# Patient Record
Sex: Female | Born: 1960
Health system: Southern US, Community
[De-identification: ages and names within clinical notes are randomized; demographics above are authoritative.]

## PROBLEM LIST (undated history)

## (undated) DIAGNOSIS — F32A Depression, unspecified: Secondary | ICD-10-CM

## (undated) DIAGNOSIS — E039 Hypothyroidism, unspecified: Secondary | ICD-10-CM

## (undated) DIAGNOSIS — G709 Myoneural disorder, unspecified: Secondary | ICD-10-CM

## (undated) DIAGNOSIS — F419 Anxiety disorder, unspecified: Secondary | ICD-10-CM

## (undated) DIAGNOSIS — F329 Major depressive disorder, single episode, unspecified: Secondary | ICD-10-CM

## (undated) DIAGNOSIS — C801 Malignant (primary) neoplasm, unspecified: Secondary | ICD-10-CM

## (undated) HISTORY — PX: DIAGNOSTIC LAPAROSCOPY: SUR761

## (undated) HISTORY — PX: ABDOMINAL HYSTERECTOMY: SHX81

## (undated) HISTORY — PX: CHOLECYSTECTOMY: SHX55

## (undated) HISTORY — PX: GANGLION CYST EXCISION: SHX1691

---

## 2000-06-17 ENCOUNTER — Ambulatory Visit (HOSPITAL_COMMUNITY): Admission: RE | Admit: 2000-06-17 | Discharge: 2000-06-17 | Payer: Self-pay | Admitting: *Deleted

## 2000-06-17 ENCOUNTER — Encounter (INDEPENDENT_AMBULATORY_CARE_PROVIDER_SITE_OTHER): Payer: Self-pay | Admitting: *Deleted

## 2000-12-24 HISTORY — PX: NECK SURGERY: SHX720

## 2000-12-24 HISTORY — PX: CERVICAL SPINE SURGERY: SHX589

## 2002-05-29 ENCOUNTER — Other Ambulatory Visit: Admission: RE | Admit: 2002-05-29 | Discharge: 2002-05-29 | Payer: Self-pay | Admitting: Obstetrics and Gynecology

## 2002-06-05 ENCOUNTER — Encounter: Admission: RE | Admit: 2002-06-05 | Discharge: 2002-06-05 | Payer: Self-pay | Admitting: Obstetrics and Gynecology

## 2002-06-05 ENCOUNTER — Encounter: Payer: Self-pay | Admitting: Obstetrics and Gynecology

## 2002-06-11 ENCOUNTER — Encounter: Admission: RE | Admit: 2002-06-11 | Discharge: 2002-06-11 | Payer: Self-pay | Admitting: Obstetrics and Gynecology

## 2002-06-11 ENCOUNTER — Encounter: Payer: Self-pay | Admitting: Obstetrics and Gynecology

## 2002-06-11 ENCOUNTER — Encounter (INDEPENDENT_AMBULATORY_CARE_PROVIDER_SITE_OTHER): Payer: Self-pay | Admitting: Specialist

## 2002-12-24 HISTORY — PX: NECK SURGERY: SHX720

## 2003-06-22 ENCOUNTER — Other Ambulatory Visit: Admission: RE | Admit: 2003-06-22 | Discharge: 2003-06-22 | Payer: Self-pay | Admitting: Obstetrics and Gynecology

## 2003-08-17 ENCOUNTER — Encounter: Payer: Self-pay | Admitting: Orthopaedic Surgery

## 2003-08-19 ENCOUNTER — Encounter: Payer: Self-pay | Admitting: Orthopaedic Surgery

## 2003-08-19 ENCOUNTER — Ambulatory Visit (HOSPITAL_COMMUNITY): Admission: RE | Admit: 2003-08-19 | Discharge: 2003-08-20 | Payer: Self-pay | Admitting: Orthopaedic Surgery

## 2003-08-20 ENCOUNTER — Encounter: Payer: Self-pay | Admitting: Orthopaedic Surgery

## 2003-10-19 ENCOUNTER — Inpatient Hospital Stay (HOSPITAL_COMMUNITY): Admission: AD | Admit: 2003-10-19 | Discharge: 2003-10-21 | Payer: Self-pay | Admitting: Obstetrics and Gynecology

## 2003-10-19 ENCOUNTER — Encounter (INDEPENDENT_AMBULATORY_CARE_PROVIDER_SITE_OTHER): Payer: Self-pay | Admitting: *Deleted

## 2004-08-23 ENCOUNTER — Other Ambulatory Visit: Admission: RE | Admit: 2004-08-23 | Discharge: 2004-08-23 | Payer: Self-pay | Admitting: Obstetrics and Gynecology

## 2004-09-08 ENCOUNTER — Encounter: Admission: RE | Admit: 2004-09-08 | Discharge: 2004-09-08 | Payer: Self-pay | Admitting: Obstetrics and Gynecology

## 2004-09-15 ENCOUNTER — Encounter (INDEPENDENT_AMBULATORY_CARE_PROVIDER_SITE_OTHER): Payer: Self-pay | Admitting: *Deleted

## 2004-09-15 ENCOUNTER — Encounter: Admission: RE | Admit: 2004-09-15 | Discharge: 2004-09-15 | Payer: Self-pay | Admitting: Obstetrics and Gynecology

## 2004-12-21 ENCOUNTER — Ambulatory Visit (HOSPITAL_COMMUNITY): Admission: RE | Admit: 2004-12-21 | Discharge: 2004-12-22 | Payer: Self-pay | Admitting: Orthopaedic Surgery

## 2005-08-24 ENCOUNTER — Encounter: Admission: RE | Admit: 2005-08-24 | Discharge: 2005-08-24 | Payer: Self-pay | Admitting: Obstetrics and Gynecology

## 2005-09-14 ENCOUNTER — Other Ambulatory Visit: Admission: RE | Admit: 2005-09-14 | Discharge: 2005-09-14 | Payer: Self-pay | Admitting: Obstetrics and Gynecology

## 2006-09-03 ENCOUNTER — Encounter: Admission: RE | Admit: 2006-09-03 | Discharge: 2006-09-03 | Payer: Self-pay | Admitting: Obstetrics and Gynecology

## 2007-09-15 ENCOUNTER — Encounter: Admission: RE | Admit: 2007-09-15 | Discharge: 2007-09-15 | Payer: Self-pay | Admitting: Obstetrics and Gynecology

## 2008-09-20 ENCOUNTER — Encounter: Admission: RE | Admit: 2008-09-20 | Discharge: 2008-09-20 | Payer: Self-pay | Admitting: Obstetrics and Gynecology

## 2009-04-22 ENCOUNTER — Encounter: Admission: RE | Admit: 2009-04-22 | Discharge: 2009-04-22 | Payer: Self-pay | Admitting: Gastroenterology

## 2009-10-17 ENCOUNTER — Encounter: Admission: RE | Admit: 2009-10-17 | Discharge: 2009-10-17 | Payer: Self-pay | Admitting: Obstetrics and Gynecology

## 2011-05-11 NOTE — Op Note (Signed)
NAME:  Candice Ramirez, Candice Ramirez                        ACCOUNT NO.:  [id number]   MEDICAL RECORD NO.:  [id number]                   PATIENT TYPE:  OIB   LOCATION:  NA                                   FACILITY:  [facility]   PHYSICIAN:  Sharolyn Douglas, M.D.                     DATE OF BIRTH:  [birth year]   DATE OF PROCEDURE:  08/19/2003  DATE OF DISCHARGE:                                 OPERATIVE REPORT   DIAGNOSIS:  C5-6 herniated nucleus pulposus with left upper extremity  radiculopathy.   PROCEDURES:  1. Anterior cervical diskectomy, C5-6, with decompression of the spinal cord     and nerve roots bilaterally.  2. Anterior cervical arthrodesis, C5-6, with placement of an 8 mm Synthes     allograft prosthesis spacer packed with NuGraft bone graft substitute.  3. Anterior cervical plating utilizing the Trinica system, C5-6.  4. Neurologic monitoring utilizing somatosensory evoked potentials and motor-     evoked potentials.   SURGEON:  Sharolyn Douglas, M.D.   ASSISTANT:  Verlin Fester, P.A.   ANESTHESIA:  General endotracheal.   COMPLICATIONS:  None.   INDICATIONS:  The patient is a 50 year old female with a four-plus month  history of progressively worsening neck and left upper extremity radicular  pain, numbness, and weakness.  Her plain radiographs show spondylitic  changes with loss of lordosis.  MRI scan shows a large C5-6 disk herniation  lateralizing to the left with compression of the thecal sac.  She failed an  appropriate course of nonoperative management.  She counseled regarding ACDF  at C5-6 with allograft and plate. She elected to proceed understanding the  risks, benefits, and alternatives.   DESCRIPTION OF PROCEDURE:  The patient was properly identified in the  holding area, taken to the operating room.  She underwent general  endotracheal anesthesia without difficulty.  She was given prophylactic IV  antibiotics.  Neurologic monitoring was established in the form of SSEPs and  motor-evoked potentials.  She was carefully positioned on the operating room  table with the neck in slight extension using the Mayfield head rest.  Five  pounds of Holter traction was applied.  Her neck prepped and draped in the  usual sterile fashion.  A 3 cm incision made at the level of the cricoid  cartilage in a natural skin crease on the left side of the neck.  Dissection  carried down sharply through the platysma.  The interval between the SCM and  strap muscles medially was developed down the prevertebral space.  The first  disk space identified was C5-6, x-ray taken to confirm this location.  Longus colli muscle elevated out over the C5-6 disk space bilaterally.  A  deep Shadow Line retractor placed.  Carotid sheath, esophagus, trachea  identified and protected at all times.  Sharp annulotomy was performed.  Diskectomy carried back to the posterior longitudinal ligament using  Kerrison punches and pituitary rongeurs.  A high-speed bur used to remove  the posterior vertebral margins and uncovertebral joint.  A large disk  fragment was removed from the spinal canal on the left side.  The posterior  longitudinal ligament was taken down and the spinal canal explored.  Foraminotomies were performed bilaterally using 2 mm Kerrison punch.  The  wound was irrigated.  Bleeding was controlled with bipolar electrocautery  and Gelfoam.  An 8 mm Synthes allograft prosthesis spacer was then carefully  tamped into the interspace after being packed with NuGraft bone graft  substitute.  The graft was carefully countersunk 1 mm.  We checked  posteriorly using a blunt probe.  There was room between the graft and the  spinal canal.  A 24 mm Trinica plate was then placed with four 12 mm  variable screws.  We ensured the locking mechanism engaged.  Intraoperative  x-ray showed excellent position of the plate and screws at C5-6.  Deep  Penrose drain was placed.  Platysma muscle closed with 2-0 Vicryl  suture,  subcutaneous layer closed with a 3-0 Vicryl followed by a running 4-0 Vicryl  subcuticular Vicryl suture, Benzoin and Steri-Strips placed, sterile  dressing applied, soft collar placed.  It should be noted that there were no  deleterious changes in the SSEPs or motor-evoked potentials throughout the  procedure.                                               Sharolyn Douglas, M.D.    MC/MEDQ  D:  08/19/2003  T:  08/19/2003  Job:  [id number]

## 2011-05-11 NOTE — Procedures (Signed)
[place]  Patient:    Candice Ramirez, Candice Ramirez                       MRN: [id number] Proc. Date: 06/17/00 Adm. Date:  [id number] Disc. Date: [id number] Attending:  Mingo Amber CC:         Kizzie Furnish, M.D.                           Procedure Report  PROCEDURE:  Video upper endoscopy and video colonoscopy.  INDICATION FOR PROCEDURE:  This is a 50 year old female who has a variety of complaints including chronic diarrhea, intermittent nausea and vomiting, recent Helicobacter pyloric gastritis and history of polyps on anoscopy.  She has also seen blood and mucus in the stool on occasion.  It was my impression that she had irritable bowel syndrome, possibly also related to post cholecystectomy diarrhea.  She was treated with desipramine and Levbid.  She apparently has stopped the desipramine because she thought it was aggravating her migraines.  Her abdominal symptoms are relatively unchanged.  PREPARATIONS:  She is n.p.o. since midnight having taken Phospho-Soda prep and a clear liquid diet.  The mucosa is clean throughout.  PREPROCEDURE SEDATION:  She received 100 mcg of fentanyl and 10 mg of Versed prior to the upper endoscopy.  In addition, her throat was anesthetized with Hurricaine spray and she was on 2 liters of nasal cannula O2.  PROCEDURE IN DETAIL:  The Olympus video upper endoscope was inserted via the mouth and advanced easily through the upper esophageal sphincter.  Intubation was then carried out to the descending duodenum.  On withdrawal the mucosa was carefully evaluated.  Descending duodenum and bulb appeared normal.  It was noted that there was considerable difficulty getting the scope through the pylorus indicating some pylorospasm or possibly a minimal pyloric stricture. There was mild antral gastritis.  A CLOtest was taken.  Retroflex to the GE junction was unremarkable.  Z-line of the esophagus occurred at approximately 38  cm and was somewhat irregular.  The margin was biopsied to rule out Barretts.  At the conclusion of this procedure the patients position was reversed for procedure #2 video colonoscopy.  She required an additional 75 mcg of fentanyl and 7.5 mg of Versed in order to achieve the adequate sedation.  The Olympus video colonoscope was inserted via the rectum and advanced through a tortuous sigmoid to the splenic flexure.  At this point, intubation to the cecum was technically much easier.  Cecal landmarks were identified and photographed.  The ileocecal valve was traversed and the terminal ilium appeared normal.  On withdrawal through the colon the mucosa was carefully evaluated.  The entire right colon was completely normal. Within the rectum were several diminutive polyps that were removed with hot biopsy forceps.  They appeared to be hyperplastic visually.  Retroflexed view of the rectum itself was unremarkable.  The patient tolerated the procedure well.  Multiple random biopsies were taken of normal right-sided colonic mucosa to rule out microscope colitis.  She was observed in recovery for 45 minutes and discharged home alert with a benign abdomen.  IMPRESSION: 1. Probable mild reflux and non-ulcerative dyspepsia, rule out Helicobacter    pyloric gastritis. 2. Probable irritable bowel syndrome. 3. diminutive probable hyperplastic polyps.  PLAN:  Biopsies and CLOtest will be reviewed.  The patient should continue her current therapy and return to the office within  3 weeks for review of results and further suggestions. DD:  06/17/00 TD:  06/18/00 Job: [id number]

## 2011-05-11 NOTE — H&P (Signed)
NAME:  Candice Ramirez, Candice Ramirez                        ACCOUNT NO.:  [id number]   MEDICAL RECORD NO.:  [id number]                   PATIENT TYPE:  INP   LOCATION:  NA                                   FACILITY:  [facility]   PHYSICIAN:  Duke Salvia. Marcelle Overlie, M.D.            DATE OF BIRTH:  04-09-1961   DATE OF ADMISSION:  DATE OF DISCHARGE:                                HISTORY & PHYSICAL   She is scheduled for surgery at [facility] October 26 at 7:30.   CHIEF COMPLAINT:  Stress urinary incontinence, recurrent ovarian cyst.   HISTORY OF PRESENT ILLNESS:  A 50 year old, G2, P2 whose had a prior  hysterectomy. She presented earlier this year complaining of SUI symptoms  and underwent basic cystometric's in our office. Her PVR was normal with  normal bladder capacity. With a full bladder, she had definite drop in the  UV angle with leaking. She has also had problems with a recurrent ovarian  cyst and would prefer to have USO or BSO if ovarian pathology is noted at  the time of surgery. Her ovaries are small and normal in appearance given  her age, she agrees with ovarian conservation. The procedure of a laparotomy  with possible BSO and Burch colposuspension with placement of a suprapubic  catheter reviewed with her in detail. The risks relative to bleeding,  infection, adjacent organ injury, wound infection, possible need for a  prolonged catheterization are reviewed with her. Her expected recovery time  discussed also.   Of note that she had to cancel this surgery because of C5-6 cervical  diskectomy which she has recovered from. This presented some acute neck pain  and was evaluated and treated back in July of this year.   PAST MEDICAL HISTORY:  Allergies:  DEMEROL, MORPHINE, CODEINE.   CURRENT MEDICATIONS:  1. Levothroid.  2. Desipramine.  3. Xanax p.r.n.  4. Imitrex p.r.n.   OBSTETRICAL HISTORY:  Two vaginal deliveries without complication.   OTHER SURGERIES:  1.  Cholecystectomy.  2. TAH.   FAMILY HISTORY:  Significant for a father who died with Hodgkin's disease,  grandmother and mother both who had colon cancer, brother who had coronary  artery disease, congestive heart failure and a MI.  The patient had a  history of IBS and anemia in the past.   SOCIAL HISTORY:  She smokes 1 1/2 PPD, rare alcohol use.   PHYSICAL EXAMINATION:  VITAL SIGNS:  Temperature 98.2, blood pressure  144/80.  HEENT:  Unremarkable.  NECK:  Supple without masses.  LUNGS:  Clear.  CARDIOVASCULAR:  Regular rate and rhythm without murmurs, rubs or gallops.  BREASTS:  Without masses.  ABDOMEN:  Soft, flat and nontender.  PELVIC:  Normal external genitalia. Vaginal cuff clear. Bimanual negative.  EXTREMITIES:  Unremarkable.  NEUROLOGIC:  Unremarkable.   IMPRESSION:  1. Stress urinary incontinence.  2. Recurrent ovarian cyst.   PLAN:  Laparotomy with possible bilateral  salpingo-oophorectomy, Burch  colposuspension and placement of suprapubic catheter. The procedure and  risks reviewed as above.                                               Richard M. Marcelle Overlie, M.D.    RMH/MEDQ  D:  10/18/2003  T:  10/18/2003  Job:  [id number]

## 2011-05-11 NOTE — Op Note (Signed)
NAMEMARJIE, Ramirez              ACCOUNT NO.:  [id number]   MEDICAL RECORD NO.:  [id number]          PATIENT TYPE:  OIB   LOCATION:  2899                         FACILITY:  [facility]   PHYSICIAN:  Sharolyn Douglas, M.D.        DATE OF BIRTH:  12-10-1961   DATE OF PROCEDURE:  12/21/2004  DATE OF DISCHARGE:                                 OPERATIVE REPORT   DIAGNOSES:  1.  C4-5 disk herniation.  2.  History of C5-6 anterior cervical diskectomy and fusion with plate.   PROCEDURE:  1.  Exploration of C5-6 fusion and removal of anterior cervical plate.  2.  Anterior cervical diskectomy C4-5.  3.  Anterior cervical arthrodesis C4-5, placement of allograft prosthesis,      space packed with local autogenous bone graft.  4.  Anterior cervical plating C4-5 using the spinal concept system.   SURGEON:  Sharolyn Douglas, M.D.   ASSISTANT:  Verlin Fester, P.A.   ANESTHESIA:  General endotracheal.   COMPLICATIONS:  None.   INDICATIONS:  The patient is a pleasant 50 year old female who is status  post previous anterior cervical diskectomy and fusion at C5-6.  She did well  postoperatively until she redeveloped neck and upper extremity pain.  Repeat  imaging revealed a large disk rupture at C4-5. She was refractory to  conservative modalities and elected to undergo revision decompression and  fusion of the involved segment.  The risks and benefits were reviewed.   The patient was properly identified in the holding area and taken to the  operating room, underwent general endotracheal anesthesia without  difficulty.  She was carefully positioned supine on the operating room table  with the Mayfield head rest.  The neck was prepped and draped in the usual  sterile fashion.  The previous left-sided incision was utilized.  Dissection  was carried sharply through the platysma.  Scar tissue was carefully  dissected and the interval between the sternocleidomastoid and strap muscles  medially down to the  prevertebral space.  The esophagus, trachea and carotid  sheath were identified and carefully protected at all times.  The previous  plate was identified at C5-6 and the scar tissue was dissected.  The longus  coli muscle was elevated out over the plate.  We then used the appropriate  screw driver to remove the Zimmer Trinica plate.  The fusion was then  debrided of all soft tissue and we confirmed the solid arthrodesis.  We the  turned our attention to completing the diskectomy at C4-5.  Deep shadow line  retractor was placed.  The microscope was brought into the field.  Caspar  distraction pins were placed in the C4 and C5 vertebral bodies.  Gentle  distraction was applied.  A diskectomy was carried back to the posterior  longitudinal ligament. A high speed bur was used to remove the cartilaginous  endplates and take down the uncovertebral joints.  The posterior  longitudinal ligament was removed using the 2 mm Kerrison.  We found a large  subligamentous disk herniation in the spinal canal which was extending into  the left  neural foramen.  The canal was completely decompressed.  Wide  foraminotomies were completed.  The bleeding was controlled with bipolar  electrocautery and Gelfoam. We felt we had a good decompression.  We then  placed the 7 mm allograft prosthesis space packed with local autogenous bone  graft from the drill shavings.  This was countersunk 1 mm.  A 24 mm spinal  concepts plate was then placed from C4 to C5.  We had good screw purchase.  We utilized four 12 mm screws.  We ensured the locking mechanism engaged.  Intraoperative x-rays showed appropriate positioning of the plate and  interbody graft at C4-5. The wound was irrigated.  The esophagus, trachea,  carotid sheath were examined.  No apparent injuries.  Bleeding was  controlled with bipolar electrocautery and FloSeal.  A deep Penrose drain  was left in place.  The platysma was closed with interrupted 2-0 Vicryl,   subcutaneous layer closed with interrupted 3-0 Vicryl followed by a running  4-0 subcuticular Vicryl suture on the skin edges.  Benzoin and Steri-Strips  were placed.  Soft collar applied.  The patient was extubated without  difficulty and transferred to recovery in stable condition.      MC/MEDQ  D:  12/21/2004  T:  12/21/2004  Job:  [id number]

## 2011-05-11 NOTE — H&P (Signed)
NAME:  Candice Ramirez, Candice Ramirez                        ACCOUNT NO.:  [id number]   MEDICAL RECORD NO.:  [id number]                   PATIENT TYPE:  OIB   LOCATION:  5003                                 FACILITY:  [facility]   PHYSICIAN:  Sharolyn Douglas, M.D.                     DATE OF BIRTH:  09-27-1961   DATE OF ADMISSION:  08/19/2003  DATE OF DISCHARGE:                                HISTORY & PHYSICAL   CHIEF COMPLAINT:  Neck and left upper extremity pain.   HISTORY OF PRESENT ILLNESS:  The patient is a 50 year old female with neck  and left upper extremity pain for the last four to five months.  It has been  progressively getting worse in intensity.  She has failed conservative  management, including anti-inflammatory medications, pain medications,  therapy, and rest.  Unfortunately, the pain continues to worsen.  At this  point, the pain is severe and is interfering with her activities of daily  living and ability to work and function.  She was also found to have an HNP  at C5-C6 on MRI scan.  Therefore, the risks and benefits of the proposed  surgery were discussed with the patient by Dr. Noel Gerold, as well as myself.  She indicated understanding and opted to proceed.   ALLERGIES:  1. CODEINE causes anxiety.  2. DARVOCET causes headache and rash.   MEDICATIONS:  1. Levothroid 0.1 mg p.o. daily.  2. Desipramine 25 mg p.o. daily.  3. Xanax 0.25 mg p.r.n.  4. Vicodin 5 mg p.r.n.   PAST MEDICAL HISTORY:  1. Anxiety.  2. Hypothyroidism.   PAST SURGICAL HISTORY:  1. Partial hysterectomy in 1991.  2. Cholecystectomy in 1992.   SOCIAL HISTORY:  The patient is married.  She smokes two packs of cigarettes  per day.  Uses alcohol on occasion.  She has two children, ages 23 and 60.   FAMILY MEDICAL HISTORY:  Father deceased of Hodgkin's disease.  Mother alive  at age 20 with a history of TIAs.  A brother deceased at age 48 secondary to  MI.   REVIEW OF SYSTEMS:  The patient denies fever,  chills or sweats.  CENTRAL  NERVOUS SYSTEM:  Denies blurred vision, double vision, seizure, headache,  and paralysis.  CARDIOVASCULAR:  Denies chest pain, angina, orthopnea,  claudication, and palpitations.  PULMONARY:  Denies shortness of breath,  productive cough, and hemoptysis.  GASTROINTESTINAL:  Denies nausea,  vomiting, constipation, diarrhea, melena, and bloody stool.  GENITOURINARY:  Denies dysuria, hematuria, and discharge.  MUSCULOSKELETAL:  As per HPI.   PHYSICAL EXAMINATION:  VITAL SIGNS:  Blood pressure 134/86, respirations 16  and unlabored, pulse 80 and regular.  GENERAL APPEARANCE:  The patient is a 50 year old female who is alert and  oriented and in no acute distress.  Well nourished and well groomed.  Appears her stated age.  Pleasant and cooperative to exam.  HEENT:  The head is normocephalic and atraumatic.  Pupils equal, round, and  reactive.  Extraocular movements intact.  Nose patent.  Pharynx clear.  NECK:  Soft to palpable.  No lymphadenopathy, thyromegaly, or bruits  appreciated.  CHEST:  Clear to auscultation bilaterally.  No rales, rhonchi, stridor,  wheezes, or friction rubs.  HEART:  S1 and S2.  Regular rate and rhythm.  No murmurs, rubs, or gallops  noted.  ABDOMEN:  Positive bowel sounds.  Nontender and nondistended.  No  organomegaly noted.  GENITOURINARY:  Not pertinent and not performed.  EXTREMITIES:  The patient has left upper extremity pain.  Motor function and  sensation are grossly intact.  Pulses are intact and symmetric.  SKIN:  Intact without any lesions or rashes.   LABORATORY DATA:  MRI shows HNP to the left at C5-C6.   IMPRESSION:  1. Herniated nucleus pulposis at C5-C6.  2. Anxiety.  3. Hypothyroidism.   PLAN:  1. Admit to [facility] on August 19, 2003, for ACPS at C5-C6 by Sharolyn Douglas, M.D.  2. The patient's primary care physician is Dr. Lawana Pai.      Verlin Fester, P.A.                       Sharolyn Douglas, M.D.     CM/MEDQ  D:  08/19/2003  T:  08/19/2003  Job:  [id number]

## 2011-05-11 NOTE — H&P (Signed)
NAME:  Candice Ramirez, Candice Ramirez              ACCOUNT NO.:  [id number]   MEDICAL RECORD NO.:  [id number]          PATIENT TYPE:  OIB   LOCATION:  NA                           FACILITY:  [facility]   PHYSICIAN:  Sharolyn Douglas, M.D.        DATE OF BIRTH:  [birth year]   DATE OF ADMISSION:  12/21/2004  DATE OF DISCHARGE:                                HISTORY & PHYSICAL   CHIEF COMPLAINT:  Pain in my neck and left shoulder with pain down my left  arm.Marland Kitchen   PRESENT ILLNESS:  This 50 year old white female successfully underwent  cervical fusion about a year and a half ago at C5-C6 and that is per her  H&P.  She did well with that fusion and now has a healed fusion at C5-C6  level.  Unfortunately, she has had worsening pain over the past two months  with numbness and tingling into her left forearm as well as in her hand.  She had difficulty raising the left arm and feels weakness.  She has had  no problems with balance, bladder, or bowel dysfunction.  She now has to use  analgesics and had minimal response to a steroid dose pack.  MRI has shown a  large disk rupture at C4-C5 with mass effect on the spinal cord.  Unfortunately, this occasionally occurs with this presenting picture.  After  much discussion including the risks and benefits as well as pros and cons of  surgery, she decided to go ahead with surgical intervention with removal of  the hardware at C5-C6 at the successful fusion and anterior cervical  diskectomy and fusion at C4-C5.  The patient has been cleared preoperatively  by Dr. Roney Marion of [facility] in [place], [place]  [place].   PAST MEDICAL HISTORY:  The patient is allergic to DARVOCET.   CURRENT MEDICATIONS:  1.  Levothroid 0.1 mg one every day.  2.  Crestor 5 mg one every day.  3.  Desipramine 25 mg one every day.  4.  Xanax 0.25 mg p.r.n.  5.  Hydrocodone p.r.n.   1.  This lady has anxiety/depression.  2.  She also has hypothyroid.   PAST SURGERY:  1.  Partial  hysterectomy, 1991.  2.  Cholecystectomy via laparoscopy in 1997.  3.  Cervical surgery as mentioned above.  4.  Bladder tack with oophorectomy in 2004.   FAMILY HISTORY:  Positive for heart disease in her brother.  Cancer, Hodgkin  type cancer, in her father.  Mother had a stroke.   SOCIAL HISTORY:  The patient is married.  She is employed.  Smokes a pack  and a half of cigarettes per day.  Has occasional intake of ETOH (beer).  Has two children.  Her husband will be the primary caregiver after surgery.   REVIEW OF SYSTEMS:  CNS:  No seizure disorder or paralysis but the patient  does have radiculitis as mentioned above.  CARDIOVASCULAR:  No chest pain,  no angina, no orthopnea.  RESPIRATORY:  No productive cough, however, she  now has seasonal allergies and had a cough that  has responded to Mucinex.  No hemoptysis, no shortness of breath.  GASTROINTESTINAL:  No nausea,  vomiting, melena, bloody stools.  She does have occasional diarrhea.  GENITOURINARY:  No discharge, dysuria, hematuria.  MUSCULOSKELETAL:  Primarily in present illness.   PHYSICAL EXAMINATION:  GENERAL:  Alert, cooperative, friendly, apprehensive,  50 year old white female.  VITAL SIGNS:  Blood pressure 130/86, pulse 88, respirations 12.  HEENT:  Normocephalic.  PERRLA.  EOMs intact.  Oropharynx is clear.  CHEST:  Clear to auscultation.  No rhonchi.  No rales.  HEART:  Regular rate and rhythm.  No murmurs were heard.  ABDOMEN:  Soft, nontender.  Liver spleen not felt.  GENITALIA/RECTAL:  Not done, not pertinent to present illness.  EXTREMITIES:  Upper extremities as in present illness above.  NECK:  The cervical spine reveals pain with extension and side bending to  the left.  Spurling's is positive.  She has weakness with range of motion  throughout the left upper extremity.  Impingement signs are negative.   ADMITTING DIAGNOSES:  1.  C4-C5 adjacent segmental disease with herniated nucleus pulposus.  2.   Hypothyroidism.   PLAN:  The patient will undergo removal of hardware C5-C6 and anterior  cervical diskectomy and fusion at C4-C5.      DLU/MEDQ  D:  12/20/2004  T:  12/20/2004  Job:  [id number]   cc:   Roney Marion, Dr.  [facility]  Fax:  [phone]

## 2011-05-11 NOTE — Op Note (Signed)
NAME:  Candice Ramirez, Candice Ramirez                        ACCOUNT NO.:  [id number]   MEDICAL RECORD NO.:  [id number]                   PATIENT TYPE:  INP   LOCATION:  5003                                 FACILITY:  [facility]   PHYSICIAN:  Duke Salvia. Marcelle Overlie, M.D.            DATE OF BIRTH:  04-Jul-1961   DATE OF PROCEDURE:  10/19/2003  DATE OF DISCHARGE:  10/21/2003                                 OPERATIVE REPORT   PREOPERATIVE DIAGNOSIS:  SUI, recurrent ovarian cysts.   POSTOPERATIVE DIAGNOSIS:  SUI, recurrent ovarian cysts.  Dense left adnexal  adhesions, filmy right adnexal adhesions.   PROCEDURE:  Laparotomy with lysis of adhesions, LSO, Burch colposuspension,  placement of suprapubic catheter.   SURGEON:  Duke Salvia. Marcelle Overlie, M.D.   ASSISTANT:  Tracie Harrier, M.D.   ANESTHESIA:  General endotracheal.   ESTIMATED BLOOD LOSS:  150 mL.   SPECIMENS REMOVED:  Left tube and ovary.   PROCEDURE AND FINDINGS:  The patient went to the operating room after an  adequate level of general endotracheal anesthesia was obtained.  With the  patient in the frogleg position, the abdomen was prepped and draped in the  usual manner for sterile abdominal procedures.  A three-way Foley was  positioned.  A transverse incision was made through the old scar, which was  carried down to the fascia.  The fascia was divided transversely.  The  rectus muscle was divided in the midline.  The peritoneum entered superiorly  without incident.  A retractor was positioned and the patient placed in  Trendelenburg.  On initial inspection after the bowels were packed out of  the field, the right ovary appeared to be normal.  Several small filmy  adhesions were lysed.  The left ovary, however, was densely adherent to the  pelvic side wall and to the colon.  The round ligament on the left was  clamped, divided, and suture ligated.  The retroperitoneal space on that  side was developed.  The left IP ligament was isolated.  The  course of the  left ureter was well below.  The left IP ligament was then clamped, divided,  and doubly free tied.  With careful sharp and blunt dissection, the  remainder of the ovary was dissected free from the attachment to the colon.  Inspection of the colonic serosa revealed that it was intact and hemostatic.  After this was completed, the pelvis was irrigated and aspirated.  The right  ovary was conserved, since it was normal.  No other abnormalities were  noted.  Prior to closure, sponge, needle, and instrument counts were  reported as correct x2.  The peritoneum was closed with a running 2-0 Dexon  suture.  The space of Retzius was then developed.  The surgeon's left hand  was placed in the vagina.  With gentle downward traction on three-way Foley,  the UV angle was isolated.  The paravaginal tissue was then developed  on  either side.  A 2-0 Prolene was taken in a double-bite fashion and tacked to  the Cooper's ligament on each side.  The assistant then tied these sutures  down with excellent elevation.  The bladder was then back filled with  saline.  A small cystostomy was performed.  The hysteroscope was then  inserted into the bladder.  There was no evidence of any suture material in  the bladder.  Through a separate small incision, a small pediatric Foley was  then placed into the bladder and a double closure of the bladder mucosa and  serosa with 3-0 chromic suture.  The rectus muscles were then reapproximated  in the midline with 2-0 Dexon interrupted sutures.  The fascia  was closed from laterally to the midline on either side with a 0 PDS suture.  The subcutaneous layer was hemostatic.  Clips and Steri-Strips were used on  the skin.  The suprapubic catheter was connected to a straight drain.  The  patient tolerated this well and went to the recovery room in good condition.                                               Richard M. Marcelle Overlie, M.D.    [facility]/MEDQ  D:  10/19/2003   T:  10/19/2003  Job:  [id number]

## 2011-05-11 NOTE — Discharge Summary (Signed)
NAME:  Candice Ramirez, Candice Ramirez                        ACCOUNT NO.:  [id number]   MEDICAL RECORD NO.:  [id number]                   PATIENT TYPE:  INP   LOCATION:  9309                                 FACILITY:  [facility]   PHYSICIAN:  Duke Salvia. Marcelle Ramirez, M.D.            DATE OF BIRTH:  [birth year]   DATE OF ADMISSION:  10/19/2003  DATE OF DISCHARGE:  10/21/2003                                 DISCHARGE SUMMARY   DISCHARGE DIAGNOSES:  1. Stress urinary incontinence.  2. Recurrent ovarian cyst formation, pelvic pain.  3. Laparotomy with left salpingo-oophorectomy, lysis of adhesions, Burch     colposuspension, and placement of suprapubic catheter.   SUMMARY OF THE HISTORY AND PHYSICAL EXAMINATION:  Please see admission H&P  for details.  Briefly, a 50 year old G2, P2, prior hysterectomy who presents  for correction of SUI and evaluation and treatment of pelvic pain and  recurrent ovarian cysts.   HOSPITAL COURSE:  On October 26 under general anesthesia the patient  underwent a laparotomy with LSO.  Dense left adnexal adhesions were noted at  the time with a normal right ovary which was conserved.  This was followed  by Burch colposuspension and placement of a suprapubic catheter.  On the  first postoperative day her hemoglobin was 10.6.  She was afebrile.  Voiding  trial was started.  Due to some bladder spasm she was started on Urispas  t.i.d. and after that was started mid afternoon she did begin to void  spontaneously, although her PVR levels were still approximately 150 mL.  On  the second postoperative day she remained afebrile, was tolerating a regular  diet.  The incision was clean and dry.  She had appropriate instructions  regarding measuring her postvoid residuals and suprapubic catheter care and  was discharged home.   LABORATORY DATA:  Admission CBC:  WBC 8.2, hemoglobin 13.8.  UA was  negative.  Blood type was O+.  Antibody screen was negative.  Postoperative  hemoglobin on  October 27 was 10.6.  Pathology report is still pending.   DISPOSITION:  The patient discharged on Urispas 100 p.o. t.i.d., Tylox  p.r.n. pain.  She will return to my office in three days to have the clips  removed and also to evaluate her PVR level to see if the suprapubic catheter  is ready for removal.  She was given specific instructions regarding diet,  sex, exercise.  Advised to report any fever over 101, persistent nausea or  vomiting, increasing abdominal pain, incisional redness or drainage.   CONDITION ON DISCHARGE:  Good.   ACTIVITY:  Graded increase.                                               Candice Ramirez, M.D.    [facility]/MEDQ  D:  10/21/2003  T:  10/21/2003  Job:  [id number]

## 2011-08-15 ENCOUNTER — Other Ambulatory Visit: Payer: Self-pay | Admitting: Obstetrics and Gynecology

## 2011-08-15 DIAGNOSIS — Z1231 Encounter for screening mammogram for malignant neoplasm of breast: Secondary | ICD-10-CM

## 2011-08-20 ENCOUNTER — Ambulatory Visit
Admission: RE | Admit: 2011-08-20 | Discharge: 2011-08-20 | Disposition: A | Discharge status: Home / Self Care | Payer: Self-pay | Source: Ambulatory Visit | Attending: Obstetrics and Gynecology | Admitting: Obstetrics and Gynecology

## 2011-08-20 DIAGNOSIS — Z1231 Encounter for screening mammogram for malignant neoplasm of breast: Secondary | ICD-10-CM

## 2013-04-01 ENCOUNTER — Other Ambulatory Visit: Payer: Self-pay

## 2013-04-01 DIAGNOSIS — Z1231 Encounter for screening mammogram for malignant neoplasm of breast: Secondary | ICD-10-CM

## 2013-04-28 ENCOUNTER — Ambulatory Visit: Payer: Self-pay

## 2013-05-08 ENCOUNTER — Ambulatory Visit
Admission: RE | Admit: 2013-05-08 | Discharge: 2013-05-08 | Disposition: A | Discharge status: Home / Self Care | Payer: No Typology Code available for payment source | Source: Ambulatory Visit

## 2013-05-08 DIAGNOSIS — Z1231 Encounter for screening mammogram for malignant neoplasm of breast: Secondary | ICD-10-CM

## 2014-04-19 ENCOUNTER — Other Ambulatory Visit: Payer: Self-pay

## 2014-04-19 DIAGNOSIS — Z1231 Encounter for screening mammogram for malignant neoplasm of breast: Secondary | ICD-10-CM

## 2014-05-11 ENCOUNTER — Ambulatory Visit
Admission: RE | Admit: 2014-05-11 | Discharge: 2014-05-11 | Disposition: A | Discharge status: Home / Self Care | Payer: 59 | Source: Ambulatory Visit

## 2014-05-11 ENCOUNTER — Encounter (INDEPENDENT_AMBULATORY_CARE_PROVIDER_SITE_OTHER): Payer: Self-pay

## 2014-05-11 DIAGNOSIS — Z1231 Encounter for screening mammogram for malignant neoplasm of breast: Secondary | ICD-10-CM

## 2014-10-15 ENCOUNTER — Encounter (HOSPITAL_COMMUNITY)
Admission: RE | Admit: 2014-10-15 | Discharge: 2014-10-15 | Disposition: A | Discharge status: Home / Self Care | Payer: 59 | Source: Ambulatory Visit | Attending: Obstetrics and Gynecology | Admitting: Obstetrics and Gynecology

## 2014-10-15 ENCOUNTER — Encounter (HOSPITAL_COMMUNITY): Payer: Self-pay

## 2014-10-15 DIAGNOSIS — Z01812 Encounter for preprocedural laboratory examination: Secondary | ICD-10-CM | POA: Diagnosis present

## 2014-10-15 HISTORY — DX: Hypothyroidism, unspecified: E03.9

## 2014-10-15 HISTORY — DX: Depression, unspecified: F32.A

## 2014-10-15 HISTORY — DX: Myoneural disorder, unspecified: G70.9

## 2014-10-15 HISTORY — DX: Anxiety disorder, unspecified: F41.9

## 2014-10-15 HISTORY — DX: Malignant (primary) neoplasm, unspecified: C80.1

## 2014-10-15 HISTORY — DX: Major depressive disorder, single episode, unspecified: F32.9

## 2014-10-15 LAB — CBC
HCT: 43.8 % (ref 36.0–46.0)
Hemoglobin: 15.1 g/dL — ABNORMAL HIGH (ref 12.0–15.0)
MCH: 32.7 pg (ref 26.0–34.0)
MCHC: 34.5 g/dL (ref 30.0–36.0)
MCV: 94.8 fL (ref 78.0–100.0)
Platelets: 179 10*3/uL (ref 150–400)
RBC: 4.62 MIL/uL (ref 3.87–5.11)
RDW: 12.9 % (ref 11.5–15.5)
WBC: 8.3 10*3/uL (ref 4.0–10.5)

## 2014-10-15 NOTE — Patient Instructions (Signed)
Your procedure is scheduled on:10/21/14  Enter through the Main Entrance at : [place] up desk phone and dial [phone] and inform us of your arrival.  Please call [phone] if you have any problems the morning of surgery.  Remember: Do not eat food or drink liquids, including water, after midnight:Wed.   You may brush your teeth the morning of surgery.  Take these meds the morning of surgery with a sip of water:thyroid med  DO NOT wear jewelry, eye make-up, lipstick,body lotion, or dark fingernail polish.  (Polished toes are ok) You may wear deodorant.  If you are to be admitted after surgery, leave suitcase in car until your room has been assigned. Patients discharged on the day of surgery will not be allowed to drive home. Wear loose fitting, comfortable clothes for your ride home.

## 2014-10-18 NOTE — H&P (Signed)
Candice Ramirez  DICTATION # [id number] CSN# [id number]   Margarette Asal, MD 10/18/2014 9:07 AM

## 2014-10-18 NOTE — H&P (Signed)
Candice Ramirez, Candice Ramirez              ACCOUNT NO.:  [id number]  MEDICAL RECORD NO.:  [id number]  LOCATION:                                 FACILITY:  PHYSICIAN:  Ralene Bathe. Matthew Saras, M.D.    DATE OF BIRTH:  DATE OF ADMISSION:  10/21/2014 DATE OF DISCHARGE:                             HISTORY & PHYSICAL   CHIEF COMPLAINT:  Chronic pelvic pain.  HPI:  A 53 year old, G2, P2.  The patient underwent a prior TAH-LSO and Burch procedure with conservation of what appeared to be a normal right ovary at the time.  Due to problems with continued pelvic pain, she recently underwent a GYN ultrasound 8/15, demonstrated no free fluid. No definite mass noted.  The possibility of adnexal adhesions discussed with her due to chronicity and severity of the pain in its location on the right side.  Presumptive diagnosis of pain secondary to adhesive disease.  She presents at this time for laparoscopy with RSO, possible laparotomy with RSO.  This procedure including specific risks related to bleeding, infection, adjacent organ injury reviewed.  Other risks related to the transfusion, wound infection, phlebitis along with her expected recovery time discussed which she understands and accepts.  PAST MEDICAL HISTORY:  Allergies to codeine.  CURRENT MEDICATIONS:  Levothroid, Zocor, tramadol p.r.n., Allegra, Xanax p.r.n.  SURGICAL HISTORY:  In 1991, TAH, had Burch procedure with LSO in 2004, also had discectomy and cholecystectomy.  REVIEW OF SYSTEMS:  Significant for history of headache, IBS, thyroid disease, gallbladder disease, and arthritis.  FAMILY HISTORY:  Significant for heart disease, asthma, arthritis, and unspecified cancer.  SOCIAL HISTORY:  Denies alcohol or drug use.  She does smoke 1 PPD.  Dr. Truman Hayward is her medical doctor.  PHYSICAL EXAM:  VITAL SIGNS:  Temp 98.2, blood pressure 138/80. HEENT:  Unremarkable. NECK:  Supple without masses. LUNGS:  Clear. CARDIOVASCULAR:  Regular rate and  rhythm without murmurs, rubs, gallops noted. BREASTS:  Without masses. ABDOMEN:  Soft, flat, nontender. PELVIS:  Vulva, vagina, vaginal cuff normal.  Bimanual revealed slight tenderness on the right side.  No masses or nodularity. EXTREMITIES:  Unremarkable. NEUROLOGIC:  Unremarkable.  IMPRESSION:  Chronic pelvic pain, possible right adnexal adhesive disease.  PLAN:  Laparoscopy with RSO, possible laparotomy with RSO.  Procedure and risks discussed as above.     Gerre Ranum M. Matthew Saras, M.D.     [facility]/MEDQ  D:  10/18/2014  T:  10/18/2014  Job:  [id number]

## 2014-10-20 MED ORDER — CEFOTETAN DISODIUM 2 G IJ SOLR
2.0000 g | INTRAMUSCULAR | Status: AC
  Administered 2014-10-21: 2 g via INTRAVENOUS
  Filled 2014-10-20: qty 2

## 2014-10-21 ENCOUNTER — Observation Stay (HOSPITAL_COMMUNITY)
Admission: RE | Admit: 2014-10-21 | Discharge: 2014-10-22 | Disposition: A | Discharge status: Home / Self Care | Payer: 59 | Source: Ambulatory Visit | Attending: Obstetrics and Gynecology | Admitting: Obstetrics and Gynecology

## 2014-10-21 ENCOUNTER — Encounter (HOSPITAL_COMMUNITY)
Admission: RE | Disposition: A | Discharge status: Home / Self Care | Payer: Self-pay | Source: Ambulatory Visit | Attending: Obstetrics and Gynecology

## 2014-10-21 ENCOUNTER — Encounter (HOSPITAL_COMMUNITY): Payer: Self-pay | Admitting: Anesthesiology

## 2014-10-21 ENCOUNTER — Encounter (HOSPITAL_COMMUNITY): Payer: 59 | Admitting: Anesthesiology

## 2014-10-21 ENCOUNTER — Ambulatory Visit (HOSPITAL_COMMUNITY): Payer: 59 | Admitting: Anesthesiology

## 2014-10-21 DIAGNOSIS — Z885 Allergy status to narcotic agent status: Secondary | ICD-10-CM | POA: Insufficient documentation

## 2014-10-21 DIAGNOSIS — F17210 Nicotine dependence, cigarettes, uncomplicated: Secondary | ICD-10-CM | POA: Diagnosis not present

## 2014-10-21 DIAGNOSIS — K589 Irritable bowel syndrome without diarrhea: Secondary | ICD-10-CM | POA: Insufficient documentation

## 2014-10-21 DIAGNOSIS — K660 Peritoneal adhesions (postprocedural) (postinfection): Principal | ICD-10-CM | POA: Insufficient documentation

## 2014-10-21 DIAGNOSIS — E785 Hyperlipidemia, unspecified: Secondary | ICD-10-CM | POA: Diagnosis not present

## 2014-10-21 DIAGNOSIS — F329 Major depressive disorder, single episode, unspecified: Secondary | ICD-10-CM | POA: Insufficient documentation

## 2014-10-21 DIAGNOSIS — F418 Other specified anxiety disorders: Secondary | ICD-10-CM | POA: Diagnosis not present

## 2014-10-21 DIAGNOSIS — R102 Pelvic and perineal pain: Secondary | ICD-10-CM | POA: Diagnosis present

## 2014-10-21 DIAGNOSIS — M1990 Unspecified osteoarthritis, unspecified site: Secondary | ICD-10-CM | POA: Diagnosis not present

## 2014-10-21 DIAGNOSIS — E039 Hypothyroidism, unspecified: Secondary | ICD-10-CM | POA: Diagnosis not present

## 2014-10-21 DIAGNOSIS — Z79899 Other long term (current) drug therapy: Secondary | ICD-10-CM | POA: Insufficient documentation

## 2014-10-21 HISTORY — PX: LAPAROSCOPIC SALPINGO OOPHERECTOMY: SHX5927

## 2014-10-21 SURGERY — SALPINGO-OOPHORECTOMY, LAPAROSCOPIC
Anesthesia: General | Site: Abdomen | Laterality: Right

## 2014-10-21 MED ORDER — MIDAZOLAM HCL 2 MG/2ML IJ SOLN
INTRAMUSCULAR | Status: AC
  Filled 2014-10-21: qty 2

## 2014-10-21 MED ORDER — SODIUM CHLORIDE 0.9 % IJ SOLN
INTRAMUSCULAR | Status: AC
  Filled 2014-10-21: qty 10

## 2014-10-21 MED ORDER — KETOROLAC TROMETHAMINE 30 MG/ML IJ SOLN
INTRAMUSCULAR | Status: AC
  Filled 2014-10-21: qty 1

## 2014-10-21 MED ORDER — PROPOFOL 10 MG/ML IV EMUL
INTRAVENOUS | Status: AC
  Filled 2014-10-21: qty 20

## 2014-10-21 MED ORDER — DEXAMETHASONE SODIUM PHOSPHATE 10 MG/ML IJ SOLN
INTRAMUSCULAR | Status: DC | PRN
  Administered 2014-10-21: 4 mg via INTRAVENOUS

## 2014-10-21 MED ORDER — MIDAZOLAM HCL 2 MG/2ML IJ SOLN
INTRAMUSCULAR | Status: DC | PRN
  Administered 2014-10-21: 2 mg via INTRAVENOUS

## 2014-10-21 MED ORDER — ONDANSETRON HCL 4 MG/2ML IJ SOLN: 4.0000 mg | Freq: Four times a day (QID) | INTRAMUSCULAR | Status: DC | PRN

## 2014-10-21 MED ORDER — KETOROLAC TROMETHAMINE 30 MG/ML IJ SOLN
30.0000 mg | Freq: Four times a day (QID) | INTRAMUSCULAR | Status: DC
  Administered 2014-10-21 – 2014-10-22 (×2): 30 mg via INTRAVENOUS
  Filled 2014-10-21 (×3): qty 1

## 2014-10-21 MED ORDER — MENTHOL 3 MG MT LOZG: 1.0000 | LOZENGE | OROMUCOSAL | Status: DC | PRN

## 2014-10-21 MED ORDER — NEOSTIGMINE METHYLSULFATE 10 MG/10ML IV SOLN
INTRAVENOUS | Status: DC | PRN
  Administered 2014-10-21: 3 mg via INTRAVENOUS

## 2014-10-21 MED ORDER — LORATADINE 10 MG PO TABS
10.0000 mg | ORAL_TABLET | Freq: Every day | ORAL | Status: DC
  Administered 2014-10-21 – 2014-10-22 (×2): 10 mg via ORAL
  Filled 2014-10-21 (×2): qty 1

## 2014-10-21 MED ORDER — LEVOTHYROXINE SODIUM 100 MCG PO TABS
100.0000 ug | ORAL_TABLET | Freq: Every day | ORAL | Status: DC
  Administered 2014-10-22: 100 ug via ORAL
  Filled 2014-10-21: qty 1

## 2014-10-21 MED ORDER — ESTROGENS CONJ SYNTHETIC A 0.9 MG PO TABS
0.9000 mg | ORAL_TABLET | Freq: Every day | ORAL | Status: DC
  Filled 2014-10-21: qty 1

## 2014-10-21 MED ORDER — SCOPOLAMINE 1 MG/3DAYS TD PT72
MEDICATED_PATCH | TRANSDERMAL | Status: AC
  Filled 2014-10-21: qty 1

## 2014-10-21 MED ORDER — HYDROMORPHONE HCL 1 MG/ML IJ SOLN
INTRAMUSCULAR | Status: DC | PRN
  Administered 2014-10-21: 0.5 mg via INTRAVENOUS

## 2014-10-21 MED ORDER — DEXAMETHASONE SODIUM PHOSPHATE 4 MG/ML IJ SOLN
INTRAMUSCULAR | Status: AC
  Filled 2014-10-21: qty 1

## 2014-10-21 MED ORDER — FENTANYL CITRATE 0.05 MG/ML IJ SOLN
25.0000 ug | INTRAMUSCULAR | Status: DC | PRN
  Administered 2014-10-21 (×2): 50 ug via INTRAVENOUS

## 2014-10-21 MED ORDER — OXYCODONE-ACETAMINOPHEN 5-325 MG PO TABS
1.0000 | ORAL_TABLET | ORAL | Status: DC | PRN
  Administered 2014-10-22: 2 via ORAL
  Filled 2014-10-21: qty 2

## 2014-10-21 MED ORDER — DIPHENHYDRAMINE HCL 50 MG/ML IJ SOLN: 12.5000 mg | Freq: Four times a day (QID) | INTRAMUSCULAR | Status: DC | PRN

## 2014-10-21 MED ORDER — BUPIVACAINE HCL (PF) 0.25 % IJ SOLN
INTRAMUSCULAR | Status: AC
  Filled 2014-10-21: qty 30

## 2014-10-21 MED ORDER — ESTROGENS CONJUGATED 0.9 MG PO TABS
0.9000 mg | ORAL_TABLET | Freq: Every day | ORAL | Status: DC
  Administered 2014-10-21 – 2014-10-22 (×2): 0.9 mg via ORAL
  Filled 2014-10-21 (×2): qty 1

## 2014-10-21 MED ORDER — DIPHENHYDRAMINE HCL 12.5 MG/5ML PO ELIX
12.5000 mg | ORAL_SOLUTION | Freq: Four times a day (QID) | ORAL | Status: DC | PRN
Start: 2014-10-21 — End: 2014-10-22

## 2014-10-21 MED ORDER — ONDANSETRON HCL 4 MG/2ML IJ SOLN
4.0000 mg | Freq: Four times a day (QID) | INTRAMUSCULAR | Status: DC | PRN
Start: 2014-10-21 — End: 2014-10-22

## 2014-10-21 MED ORDER — HYDROMORPHONE HCL 1 MG/ML IJ SOLN
INTRAMUSCULAR | Status: AC
  Administered 2014-10-21: 0.5 mg via INTRAVENOUS
  Filled 2014-10-21: qty 1

## 2014-10-21 MED ORDER — NALOXONE HCL 0.4 MG/ML IJ SOLN: 0.4000 mg | INTRAMUSCULAR | Status: DC | PRN

## 2014-10-21 MED ORDER — SODIUM CHLORIDE 0.9 % IJ SOLN
INTRAMUSCULAR | Status: AC
  Filled 2014-10-21: qty 50

## 2014-10-21 MED ORDER — SCOPOLAMINE 1 MG/3DAYS TD PT72
1.0000 | MEDICATED_PATCH | Freq: Once | TRANSDERMAL | Status: DC
  Administered 2014-10-21: 1.5 mg via TRANSDERMAL

## 2014-10-21 MED ORDER — KETOROLAC TROMETHAMINE 30 MG/ML IJ SOLN: 30.0000 mg | Freq: Four times a day (QID) | INTRAMUSCULAR | Status: DC

## 2014-10-21 MED ORDER — ROCURONIUM BROMIDE 100 MG/10ML IV SOLN
INTRAVENOUS | Status: DC | PRN
  Administered 2014-10-21: 30 mg via INTRAVENOUS

## 2014-10-21 MED ORDER — KETOROLAC TROMETHAMINE 30 MG/ML IJ SOLN: 30.0000 mg | Freq: Once | INTRAMUSCULAR | Status: DC

## 2014-10-21 MED ORDER — BUPIVACAINE LIPOSOME 1.3 % IJ SUSP
20.0000 mL | Freq: Once | INTRAMUSCULAR | Status: AC
Start: 2014-10-21 — End: 2014-10-21
  Administered 2014-10-21: 20 mL
  Filled 2014-10-21: qty 20

## 2014-10-21 MED ORDER — FENTANYL CITRATE 0.05 MG/ML IJ SOLN
INTRAMUSCULAR | Status: AC
  Filled 2014-10-21: qty 2

## 2014-10-21 MED ORDER — LIDOCAINE HCL (PF) 1 % IJ SOLN
INTRAMUSCULAR | Status: AC
  Filled 2014-10-21: qty 5

## 2014-10-21 MED ORDER — METOCLOPRAMIDE HCL 5 MG/ML IJ SOLN: 10.0000 mg | Freq: Once | INTRAMUSCULAR | Status: DC | PRN

## 2014-10-21 MED ORDER — ONDANSETRON HCL 4 MG/2ML IJ SOLN
INTRAMUSCULAR | Status: DC | PRN
  Administered 2014-10-21: 4 mg via INTRAVENOUS

## 2014-10-21 MED ORDER — LIDOCAINE HCL (CARDIAC) 20 MG/ML IV SOLN
INTRAVENOUS | Status: DC | PRN
  Administered 2014-10-21: 50 mg via INTRAVENOUS

## 2014-10-21 MED ORDER — ALPRAZOLAM 0.5 MG PO TABS: 0.5000 mg | ORAL_TABLET | Freq: Every evening | ORAL | Status: DC | PRN

## 2014-10-21 MED ORDER — PANTOPRAZOLE SODIUM 40 MG PO TBEC
DELAYED_RELEASE_TABLET | ORAL | Status: AC
  Filled 2014-10-21: qty 1

## 2014-10-21 MED ORDER — HYDROMORPHONE HCL 1 MG/ML IJ SOLN
INTRAMUSCULAR | Status: AC
  Filled 2014-10-21: qty 1

## 2014-10-21 MED ORDER — SODIUM CHLORIDE 0.9 % IJ SOLN
INTRAMUSCULAR | Status: DC | PRN
  Administered 2014-10-21: 3 mL via INTRAVENOUS

## 2014-10-21 MED ORDER — LACTATED RINGERS IV SOLN
INTRAVENOUS | Status: DC
  Administered 2014-10-21 (×3): via INTRAVENOUS

## 2014-10-21 MED ORDER — HYDROMORPHONE HCL 1 MG/ML IJ SOLN
0.2500 mg | INTRAMUSCULAR | Status: DC | PRN
  Administered 2014-10-21 (×2): 0.5 mg via INTRAVENOUS

## 2014-10-21 MED ORDER — MEPERIDINE HCL 25 MG/ML IJ SOLN: 6.2500 mg | INTRAMUSCULAR | Status: DC | PRN

## 2014-10-21 MED ORDER — KETOROLAC TROMETHAMINE 30 MG/ML IJ SOLN
INTRAMUSCULAR | Status: DC | PRN
  Administered 2014-10-21: 30 mg via INTRAVENOUS

## 2014-10-21 MED ORDER — PANTOPRAZOLE SODIUM 40 MG PO TBEC
40.0000 mg | DELAYED_RELEASE_TABLET | Freq: Once | ORAL | Status: AC
  Administered 2014-10-21: 40 mg via ORAL

## 2014-10-21 MED ORDER — MORPHINE SULFATE (PF) 1 MG/ML IV SOLN
INTRAVENOUS | Status: DC
  Administered 2014-10-21: 3 mg via INTRAVENOUS
  Administered 2014-10-21: 11:00:00 via INTRAVENOUS
  Administered 2014-10-21: 10 mg via INTRAVENOUS
  Administered 2014-10-21: 5 mg via INTRAVENOUS
  Administered 2014-10-22: 03:00:00 via INTRAVENOUS
  Administered 2014-10-22: 5 mg via INTRAVENOUS
  Administered 2014-10-22: 2.69 mg via INTRAVENOUS
  Administered 2014-10-22: 2.54 mg via INTRAVENOUS
  Filled 2014-10-21 (×2): qty 25

## 2014-10-21 MED ORDER — NEOSTIGMINE METHYLSULFATE 10 MG/10ML IV SOLN
INTRAVENOUS | Status: AC
  Filled 2014-10-21: qty 1

## 2014-10-21 MED ORDER — GLYCOPYRROLATE 0.2 MG/ML IJ SOLN
INTRAMUSCULAR | Status: AC
  Filled 2014-10-21: qty 3

## 2014-10-21 MED ORDER — CETYLPYRIDINIUM CHLORIDE 0.05 % MT LIQD
7.0000 mL | Freq: Two times a day (BID) | OROMUCOSAL | Status: DC
  Administered 2014-10-21 – 2014-10-22 (×2): 7 mL via OROMUCOSAL

## 2014-10-21 MED ORDER — DEXTROSE IN LACTATED RINGERS 5 % IV SOLN
INTRAVENOUS | Status: DC
  Administered 2014-10-21 – 2014-10-22 (×2): via INTRAVENOUS

## 2014-10-21 MED ORDER — ONDANSETRON HCL 4 MG PO TABS: 4.0000 mg | ORAL_TABLET | Freq: Four times a day (QID) | ORAL | Status: DC | PRN

## 2014-10-21 MED ORDER — FENTANYL CITRATE 0.05 MG/ML IJ SOLN
INTRAMUSCULAR | Status: DC | PRN
  Administered 2014-10-21 (×2): 50 ug via INTRAVENOUS
  Administered 2014-10-21: 100 ug via INTRAVENOUS
  Administered 2014-10-21: 50 ug via INTRAVENOUS

## 2014-10-21 MED ORDER — SODIUM CHLORIDE 0.9 % IJ SOLN
9.0000 mL | INTRAMUSCULAR | Status: DC | PRN
Start: 2014-10-21 — End: 2014-10-22

## 2014-10-21 MED ORDER — PROPOFOL 10 MG/ML IV BOLUS
INTRAVENOUS | Status: DC | PRN
  Administered 2014-10-21: 180 mg via INTRAVENOUS

## 2014-10-21 MED ORDER — ONDANSETRON HCL 4 MG/2ML IJ SOLN
INTRAMUSCULAR | Status: AC
  Filled 2014-10-21: qty 2

## 2014-10-21 MED ORDER — GLYCOPYRROLATE 0.2 MG/ML IJ SOLN
INTRAMUSCULAR | Status: DC | PRN
  Administered 2014-10-21: 0.4 mg via INTRAVENOUS

## 2014-10-21 MED ORDER — IBUPROFEN 800 MG PO TABS: 800.0000 mg | ORAL_TABLET | Freq: Three times a day (TID) | ORAL | Status: DC | PRN

## 2014-10-21 MED ORDER — FENTANYL CITRATE 0.05 MG/ML IJ SOLN
INTRAMUSCULAR | Status: AC
  Filled 2014-10-21: qty 5

## 2014-10-21 MED ORDER — PREGABALIN 50 MG PO CAPS
75.0000 mg | ORAL_CAPSULE | Freq: Two times a day (BID) | ORAL | Status: DC
  Administered 2014-10-21 – 2014-10-22 (×3): 75 mg via ORAL
  Filled 2014-10-21 (×6): qty 1

## 2014-10-21 MED ORDER — BUTORPHANOL TARTRATE 1 MG/ML IJ SOLN: 1.0000 mg | INTRAMUSCULAR | Status: DC | PRN

## 2014-10-21 SURGICAL SUPPLY — 42 items
BLADE SURG 11 STRL SS (BLADE) ×2 IMPLANT
CABLE HIGH FREQUENCY MONO STRZ (ELECTRODE) IMPLANT
CATH ROBINSON RED A/P 16FR (CATHETERS) ×2 IMPLANT
CELLS DAT CNTRL 66122 CELL SVR (MISCELLANEOUS) ×1 IMPLANT
CLEANER TIP ELECTROSURG 2X2 (MISCELLANEOUS) ×2 IMPLANT
CLOTH BEACON ORANGE TIMEOUT ST (SAFETY) ×2 IMPLANT
COUNTER NEEDLE 1200 MAGNETIC (NEEDLE) ×2 IMPLANT
DRSG COVADERM PLUS 2X2 (GAUZE/BANDAGES/DRESSINGS) ×4 IMPLANT
DRSG OPSITE POSTOP 3X4 (GAUZE/BANDAGES/DRESSINGS) ×2 IMPLANT
GLOVE BIO SURGEON STRL SZ7 (GLOVE) ×4 IMPLANT
GLOVE BIOGEL PI IND STRL 7.0 (GLOVE) ×2 IMPLANT
GLOVE BIOGEL PI INDICATOR 7.0 (GLOVE) ×2
GOWN STRL REUS W/TWL LRG LVL3 (GOWN DISPOSABLE) ×4 IMPLANT
LIQUID BAND (GAUZE/BANDAGES/DRESSINGS) ×2 IMPLANT
NEEDLE INSUFFLATION 120MM (ENDOMECHANICALS) ×2 IMPLANT
NS IRRIG 1000ML POUR BTL (IV SOLUTION) ×2 IMPLANT
PACK LAPAROSCOPY BASIN (CUSTOM PROCEDURE TRAY) ×2 IMPLANT
PENCIL BUTTON HOLSTER BLD 10FT (ELECTRODE) ×2 IMPLANT
POUCH SPECIMEN RETRIEVAL 10MM (ENDOMECHANICALS) IMPLANT
PROTECTOR NERVE ULNAR (MISCELLANEOUS) ×2 IMPLANT
RTRCTR WOUND ALEXIS 18CM MED (MISCELLANEOUS) ×2
SEALER TISSUE G2 CVD JAW 35 (ENDOMECHANICALS) IMPLANT
SEALER TISSUE G2 CVD JAW 45CM (ENDOMECHANICALS) IMPLANT
SET IRRIG TUBING LAPAROSCOPIC (IRRIGATION / IRRIGATOR) IMPLANT
SPONGE LAP 18X18 X RAY DECT (DISPOSABLE) ×4 IMPLANT
SUT MON AB 2-0 CT1 36 (SUTURE) IMPLANT
SUT MON AB 4-0 PS1 27 (SUTURE) ×2 IMPLANT
SUT PDS AB 0 CTX 36 PDP370T (SUTURE) ×2 IMPLANT
SUT VIC AB 0 CT1 27 (SUTURE) ×1
SUT VIC AB 0 CT1 27XCR 8 STRN (SUTURE) ×1 IMPLANT
SUT VIC AB 2-0 CT1 18 (SUTURE) IMPLANT
SUT VIC AB 2-0 CT1 27 (SUTURE) ×2
SUT VIC AB 2-0 CT1 TAPERPNT 27 (SUTURE) ×2 IMPLANT
SUT VICRYL 0 TIES 12 18 (SUTURE) ×6 IMPLANT
SUT VICRYL RAPIDE 4/0 PS 2 (SUTURE) ×2 IMPLANT
TOWEL OR 17X24 6PK STRL BLUE (TOWEL DISPOSABLE) ×4 IMPLANT
TROCAR OPTI TIP 5M 100M (ENDOMECHANICALS) ×4 IMPLANT
TROCAR XCEL DIL TIP R 11M (ENDOMECHANICALS) ×2 IMPLANT
TUBING NON-CON 1/4 X 20 CONN (TUBING) ×2 IMPLANT
WARMER LAPAROSCOPE (MISCELLANEOUS) ×2 IMPLANT
WATER STERILE IRR 1000ML POUR (IV SOLUTION) ×2 IMPLANT
YANKAUER SUCT BULB TIP NO VENT (SUCTIONS) ×2 IMPLANT

## 2014-10-21 NOTE — Anesthesia Procedure Notes (Signed)
Procedure Name: Intubation Date/Time: 10/21/2014 7:29 AM Performed by: Bufford Spikes Pre-anesthesia Checklist: Patient identified, Timeout performed, Emergency Drugs available, Suction available and Patient being monitored Patient Re-evaluated:Patient Re-evaluated prior to inductionOxygen Delivery Method: Circle system utilized Preoxygenation: Pre-oxygenation with 100% oxygen Intubation Type: IV induction Ventilation: Mask ventilation without difficulty Laryngoscope Size: Miller and 2 Grade View: Grade I Tube type: Oral Tube size: 7.0 mm Number of attempts: 1 Placement Confirmation: ETT inserted through vocal cords under direct vision,  positive ETCO2 and breath sounds checked- equal and bilateral Secured at: 21 cm Tube secured with: Tape Dental Injury: Teeth and Oropharynx as per pre-operative assessment

## 2014-10-21 NOTE — Anesthesia Postprocedure Evaluation (Signed)
  Anesthesia Post-op Note  Patient: Candice Ramirez  Procedure(s) Performed: Procedure(s): LAPAROSCOPY, CONVERT TO LAPAROTOMY WITH RIGHT SALPINGOOOPHORECTOMY (Right)  Patient Location: [facility]  Anesthesia Type:General  Level of Consciousness: awake, alert , oriented and patient cooperative  Airway and Oxygen Therapy: Patient Spontanous Breathing and Patient connected to nasal cannula oxygen  Post-op Pain: mild  Post-op Assessment: Post-op Vital signs reviewed, Patient's Cardiovascular Status Stable, Respiratory Function Stable, Patent Airway and No signs of Nausea or vomiting  Post-op Vital Signs: Reviewed and stable  Last Vitals:  Filed Vitals:   10/21/14 1300  BP: 118/70  Pulse: 66  Temp: 36.6 C  Resp: 10    Complications: No apparent anesthesia complications

## 2014-10-21 NOTE — Op Note (Signed)
Preoperative diagnosis: Pelvic pain  Postoperative diagnosis: Same, plus right adnexal adhesions, omental adhesions  Procedure: Diagnostic laparoscopy, laparotomy with RSO and lysis of adhesions  Surgeon: Matthew Saras  Anesthesia: Gen.  EBL: 75 cc  Complications: None  Drains: Foley catheter  Procedure and findings:  The patient taken the operating room after an adequate level of general anesthesia was obtained with the patient supine the abdomen prepped and draped in the usual fashion for laparoscopy the bladder was drained with a Foley catheter. Appropriate timeout taken at that point. Attention directed to the abdomen the subumbilical area was infiltrated with quarter percent Marcaine plain, small incision was made in the varies needle was introduced without difficulty. Its intra-abdominal position was verified by pressure water testing. After 2-1/2 L pneumoperitoneum syncopated, lap scopic trocar and sleeve were inserted without difficulty. There was no evidence of any bleeding or trauma. 3 finger breaths above the symphysis in the midline a 5 mm trocar was inserted under direct visualization to allow for manipulation. Pelvic findings as follows:  The uterus and left adnexa were surgically absent there were some omental adhesions into the right lower quadrant anterior abdominal wall. Right tube and ovary were visualized were were adherent to the pelvic sidewall prompting decision to proceed with laparotomy. Once this was noted the in terms removed gas allowed to escape, the subumbilical incision was closed with 4-0 subcuticular closure. Preparations were laparotomy that point.  Penicillus incision was made 2 finger breaths above the symphysis carried down to the fascia which was incised and extended transversely. Rectus muscle divided in the midline peritoneum entered superiorly without incident. Alexis retractor was placed in position. Bowel Speck superiorly out of the field. Babcock clamps and  used to grasp the right ovary using sharp and blunt dissection the adnexa was freed up from the adhesions, once this was completed and the ovary was more mobile the course of the right ureter was noted to be well below. The right IP ligament was clamped divided and suture ligated with 0 Vicryl, remaining pedicles similarly clamped divided and suture ligated with 0 Vicryl. This was hemostatic was reinspected and the operative site was well above the course of the right pelvic ureter. There was a significant amount of omental adhesions to the right lower quadrant was no evidence of bowel in this area this was dissected free with sharp and blunt dissection at the insertion freeing up the omentum. This was hemostatic.  Sponge, needle, instrument counts reported as correct 2. Peritoneum was enclose a 3-0 Vicryl suture, 3-0 Vicryl interrupted sutures used to reapproximate the rectus muscles in the midline. 0 PDS in used to from laterally to midline on either side to close the fascia subcutaneous tissue and subdermal area was infiltrated with 50% diluted `Exparel.Marland Kitchen 4-0 Monocryl subcuticular closure along with honeycomb dressing. She tolerated this well went to recovery room in good condition.  Dictated with [place].

## 2014-10-21 NOTE — Progress Notes (Signed)
The patient was re-examined with no change in status 

## 2014-10-21 NOTE — Addendum Note (Signed)
Addendum created 10/21/14 1350 by Raenette Rover, CRNA   Modules edited: Notes Section   Notes Section:  File: [id number]

## 2014-10-21 NOTE — Anesthesia Preprocedure Evaluation (Addendum)
Anesthesia Evaluation  Patient identified by MRN, date of birth, ID band Patient awake    Reviewed: Allergy & Precautions, H&P , NPO status , Patient's Chart, lab work & pertinent test results  Airway Mallampati: III  TM Distance: >3 FB Neck ROM: Full    Dental no notable dental hx. (+) Lower Dentures, Upper Dentures   Pulmonary Current Smoker,  breath sounds clear to auscultation  Pulmonary exam normal       Cardiovascular negative cardio ROS  Rhythm:Regular Rate:Normal     Neuro/Psych PSYCHIATRIC DISORDERS Anxiety Depression  Neuromuscular disease    GI/Hepatic negative GI ROS, Neg liver ROS,   Endo/Other  Hypothyroidism Hyperlipidemia  Renal/GU negative Renal ROS  negative genitourinary   Musculoskeletal negative musculoskeletal ROS (+)   Abdominal   Peds  Hematology   Anesthesia Other Findings   Reproductive/Obstetrics Pelvic Pain                            Anesthesia Physical Anesthesia Plan  ASA: II  Anesthesia Plan: General   Post-op Pain Management:    Induction: Intravenous  Airway Management Planned: Oral ETT  Additional Equipment:   Intra-op Plan:   Post-operative Plan: Extubation in OR  Informed Consent: I have reviewed the patients History and Physical, chart, labs and discussed the procedure including the risks, benefits and alternatives for the proposed anesthesia with the patient or authorized representative who has indicated his/her understanding and acceptance.   Dental advisory given  Plan Discussed with: Anesthesiologist, CRNA and Surgeon  Anesthesia Plan Comments:         Anesthesia Quick Evaluation

## 2014-10-21 NOTE — Transfer of Care (Signed)
Immediate Anesthesia Transfer of Care Note  Patient: Candice Ramirez  Procedure(s) Performed: Procedure(s): LAPAROSCOPY, CONVERT TO LAPAROTOMY WITH RIGHT SALPINGOOOPHORECTOMY (Right)  Patient Location: PACU  Anesthesia Type:General  Level of Consciousness: awake  Airway & Oxygen Therapy: Patient Spontanous Breathing and Patient connected to nasal cannula oxygen  Post-op Assessment: Report given to PACU RN and Post -op Vital signs reviewed and stable  Post vital signs: Reviewed and stable  Complications: No apparent anesthesia complications

## 2014-10-21 NOTE — Anesthesia Postprocedure Evaluation (Signed)
  Anesthesia Post-op Note  Patient: Candice Ramirez  Procedure(s) Performed: Procedure(s): LAPAROSCOPY, CONVERT TO LAPAROTOMY WITH RIGHT SALPINGOOOPHORECTOMY (Right)  Patient Location: PACU  Anesthesia Type:General  Level of Consciousness: awake, alert  and oriented  Airway and Oxygen Therapy: Patient Spontanous Breathing  Post-op Pain: mild  Post-op Assessment: Post-op Vital signs reviewed, Patient's Cardiovascular Status Stable, Respiratory Function Stable, Patent Airway, No signs of Nausea or vomiting and Pain level controlled  Post-op Vital Signs: Reviewed and stable  Last Vitals:  Filed Vitals:   10/21/14 0850  BP: 120/69  Pulse: 95  Temp: 36.4 C  Resp: 16    Complications: No apparent anesthesia complications

## 2014-10-22 ENCOUNTER — Encounter (HOSPITAL_COMMUNITY): Payer: Self-pay | Admitting: Obstetrics and Gynecology

## 2014-10-22 DIAGNOSIS — K660 Peritoneal adhesions (postprocedural) (postinfection): Secondary | ICD-10-CM | POA: Diagnosis not present

## 2014-10-22 LAB — CBC
HEMATOCRIT: 32.5 % — AB (ref 36.0–46.0)
Hemoglobin: 11.1 g/dL — ABNORMAL LOW (ref 12.0–15.0)
MCH: 32.6 pg (ref 26.0–34.0)
MCHC: 34.2 g/dL (ref 30.0–36.0)
MCV: 95.6 fL (ref 78.0–100.0)
Platelets: 163 10*3/uL (ref 150–400)
RBC: 3.4 MIL/uL — ABNORMAL LOW (ref 3.87–5.11)
RDW: 13 % (ref 11.5–15.5)
WBC: 10.6 10*3/uL — ABNORMAL HIGH (ref 4.0–10.5)

## 2014-10-22 MED ORDER — OXYCODONE-ACETAMINOPHEN 5-325 MG PO TABS: 1.0000 | ORAL_TABLET | ORAL | Status: DC | PRN

## 2014-10-22 MED ORDER — IBUPROFEN 800 MG PO TABS: 800.0000 mg | ORAL_TABLET | Freq: Three times a day (TID) | ORAL | Status: AC | PRN

## 2014-10-22 NOTE — Progress Notes (Signed)
Pt verbalizes understanding of d/c instructions, medications, follow up appts, when to seek medical attn and pt belongings policy. IV was d/c. Pt has no questions at this time. Pt d/c with prescription in hand, as well as d/c instructions and recovering from surgery book. Pt d/c to main entrance, accompanied by NT. Pt sister is driving her home.  Marry Guan

## 2014-10-22 NOTE — Discharge Summary (Signed)
Physician Discharge Summary  Patient ID: Candice Ramirez MRN: [id number] DOB/AGE: 1961-09-15 53 y.o.  Admit date: 10/21/2014 Discharge date: 10/22/2014  Admission Diagnoses:pelvic pain  Discharge Diagnoses: same, pelvic adhesions Active Problems:   Pelvic pain in female   Discharged Condition: good  Hospital Course: adm for DL>>EL with LOA + RSO, D/C on POD #1, afeb, tol PO, inc C/D  Consults: None  Significant Diagnostic Studies: labs:  Results for orders placed during the hospital encounter of 10/21/14 (from the past 24 hour(s))  CBC     Status: Abnormal   Collection Time    10/22/14  5:08 AM      Result Value Ref Range   WBC 10.6 (*) 4.0 - 10.5 K/uL   RBC 3.40 (*) 3.87 - 5.11 MIL/uL   Hemoglobin 11.1 (*) 12.0 - 15.0 g/dL   HCT 32.5 (*) 36.0 - 46.0 %   MCV 95.6  78.0 - 100.0 fL   MCH 32.6  26.0 - 34.0 pg   MCHC 34.2  30.0 - 36.0 g/dL   RDW 13.0  11.5 - 15.5 %   Platelets 163  150 - 400 K/uL    Treatments: surgery: EL with RSO, LOA  Discharge Exam: Blood pressure 95/40, pulse 67, temperature 98.1 F (36.7 C), temperature source Oral, resp. rate 13, height 5' 2.75" (1.594 m), weight 150 lb (68.04 kg), SpO2 98.00%. Inc C/D, abd soft + BS  Disposition: home, office 1 week     Medication List    STOP taking these medications       levothyroxine 100 MCG tablet  Commonly known as:  SYNTHROID, LEVOTHROID     naproxen 500 MG tablet  Commonly known as:  NAPROSYN      TAKE these medications       ALPRAZolam 0.5 MG tablet  Commonly known as:  XANAX  Take 0.5 mg by mouth at bedtime as needed for anxiety.     ESTROGENS CONJ SYNTHETIC A PO  Take 1 tablet by mouth daily.     fexofenadine 180 MG tablet  Commonly known as:  ALLEGRA  Take 180 mg by mouth daily.     ibuprofen 800 MG tablet  Commonly known as:  ADVIL,MOTRIN  Take 1 tablet (800 mg total) by mouth every 8 (eight) hours as needed for moderate pain (mild pain).     oxyCODONE-acetaminophen 5-325  MG per tablet  Commonly known as:  PERCOCET/ROXICET  Take 1-2 tablets by mouth every 4 (four) hours as needed for severe pain (moderate to severe pain (when tolerating fluids)).     pregabalin 75 MG capsule  Commonly known as:  LYRICA  Take 75 mg by mouth 2 (two) times daily.     simvastatin 80 MG tablet  Commonly known as:  ZOCOR  Take 80 mg by mouth daily.     traMADol 50 MG tablet  Commonly known as:  ULTRAM  Take 50 mg by mouth 2 (two) times daily.           Follow-up Information   Follow up with Margarette Asal, MD In 1 week. (office will call)    Specialty:  Obstetrics and Gynecology   Contact information:   [place] [address] [phone]       Signed: Margarette Asal 10/22/2014, 8:51 AM

## 2014-10-30 IMAGING — MG MM DIGITAL SCREENING BILAT W/ CAD
4 series · 4 of 4 positions shown · non-contrast
Comparison: Previous exam(s).

CLINICAL DATA: Screening.

EXAM:
DIGITAL SCREENING BILATERAL MAMMOGRAM WITH CAD

[R CC]
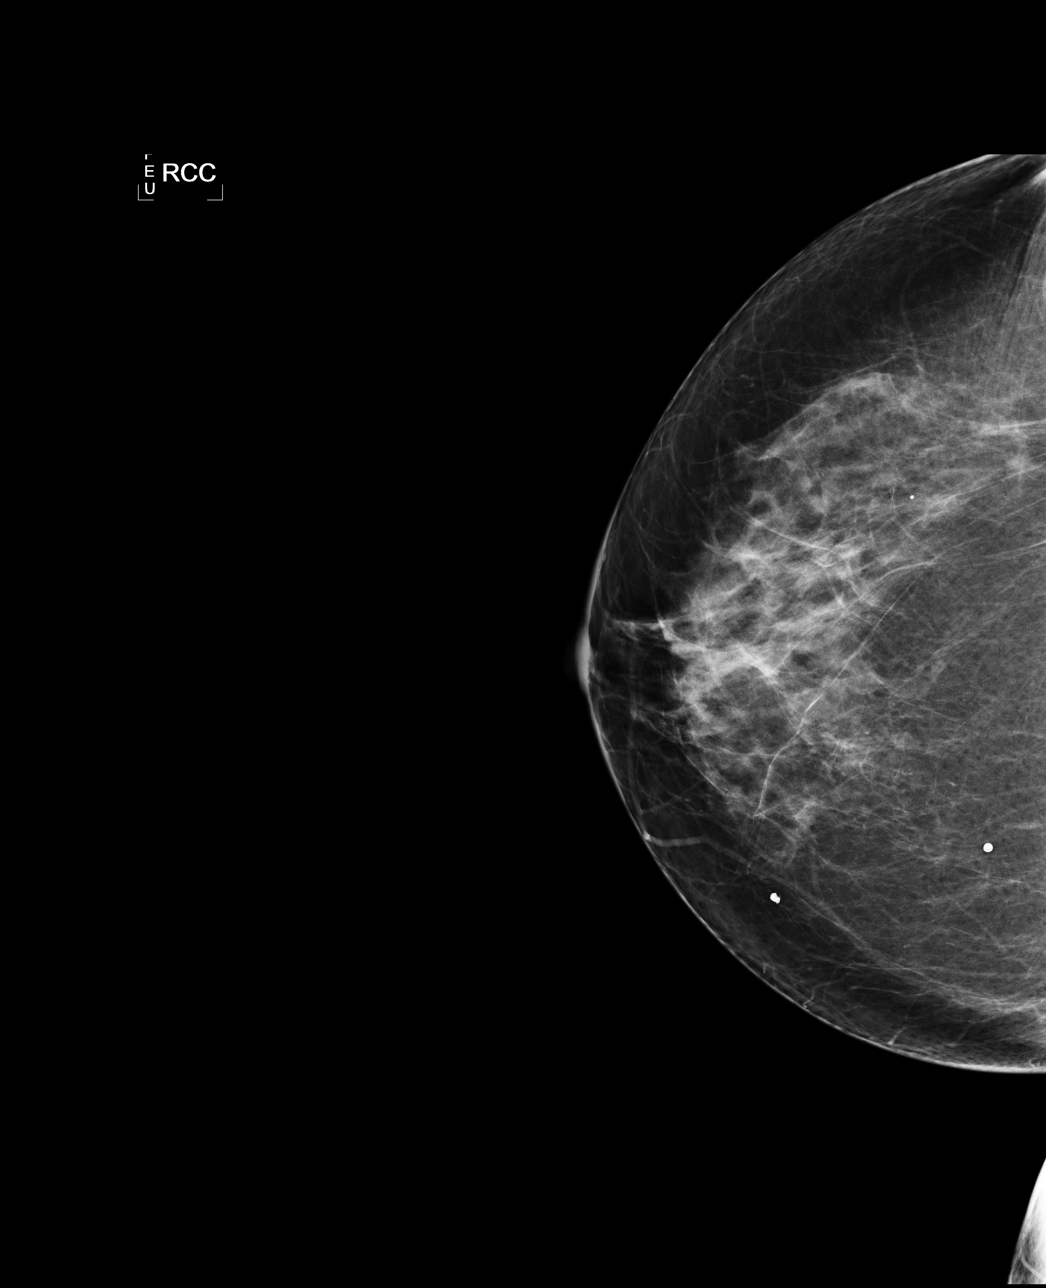

[L CC]
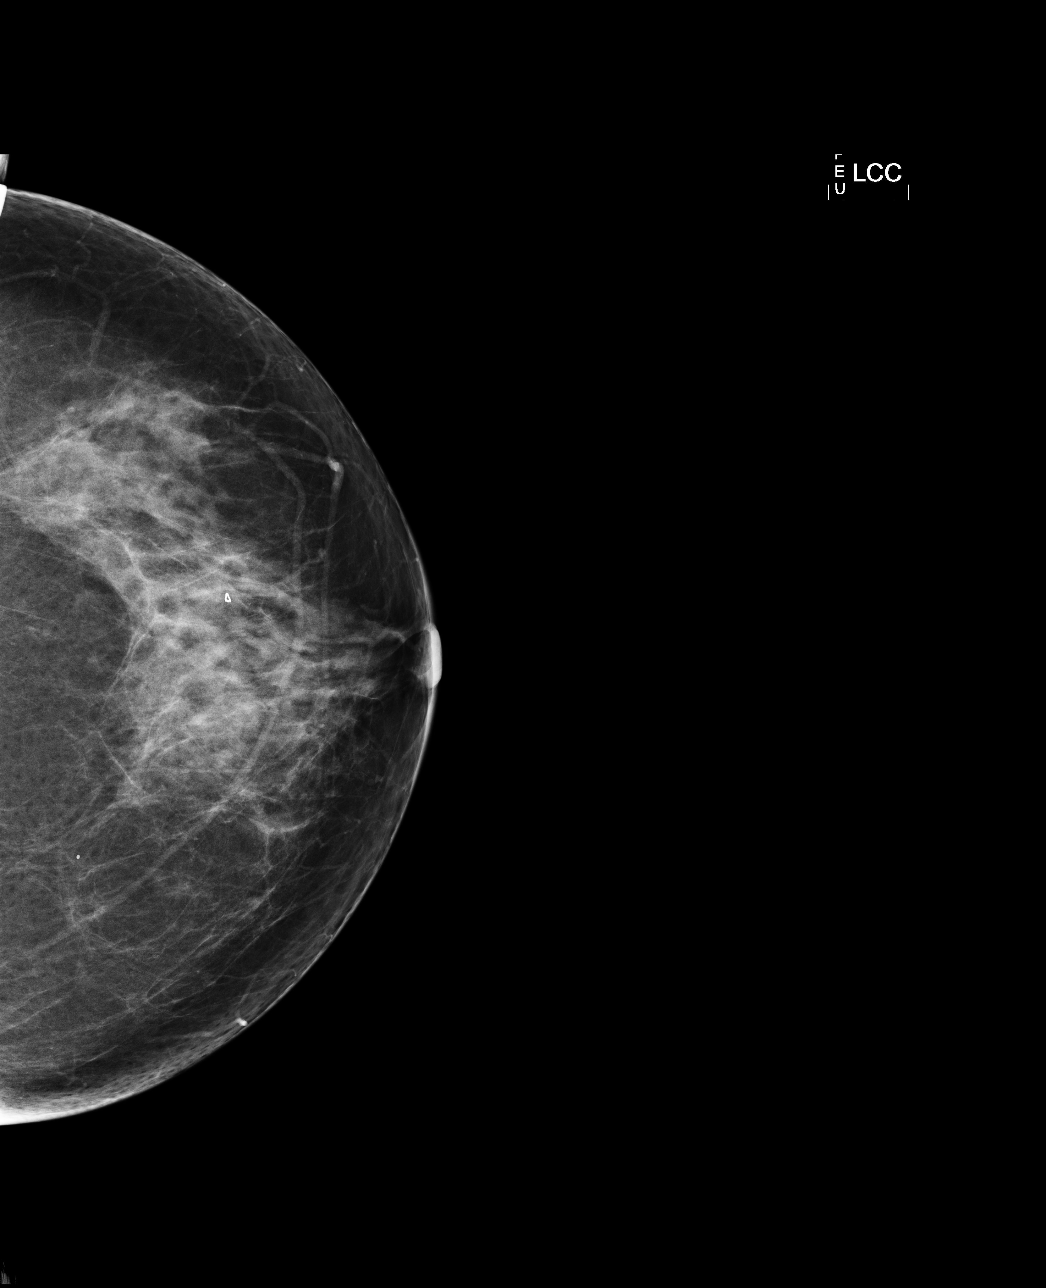

[L MLO]
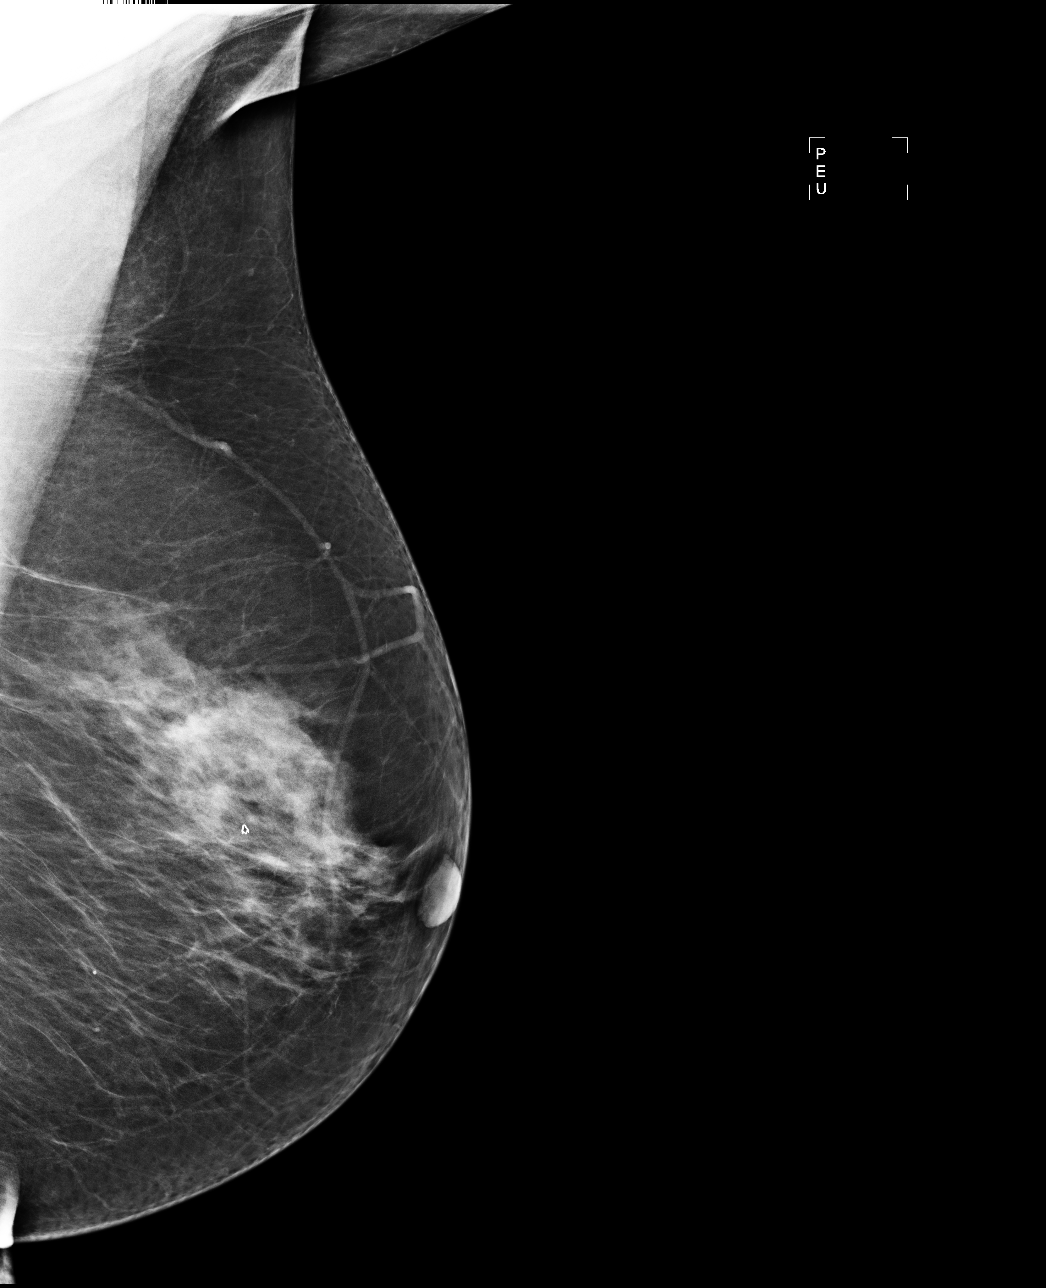

[R MLO]
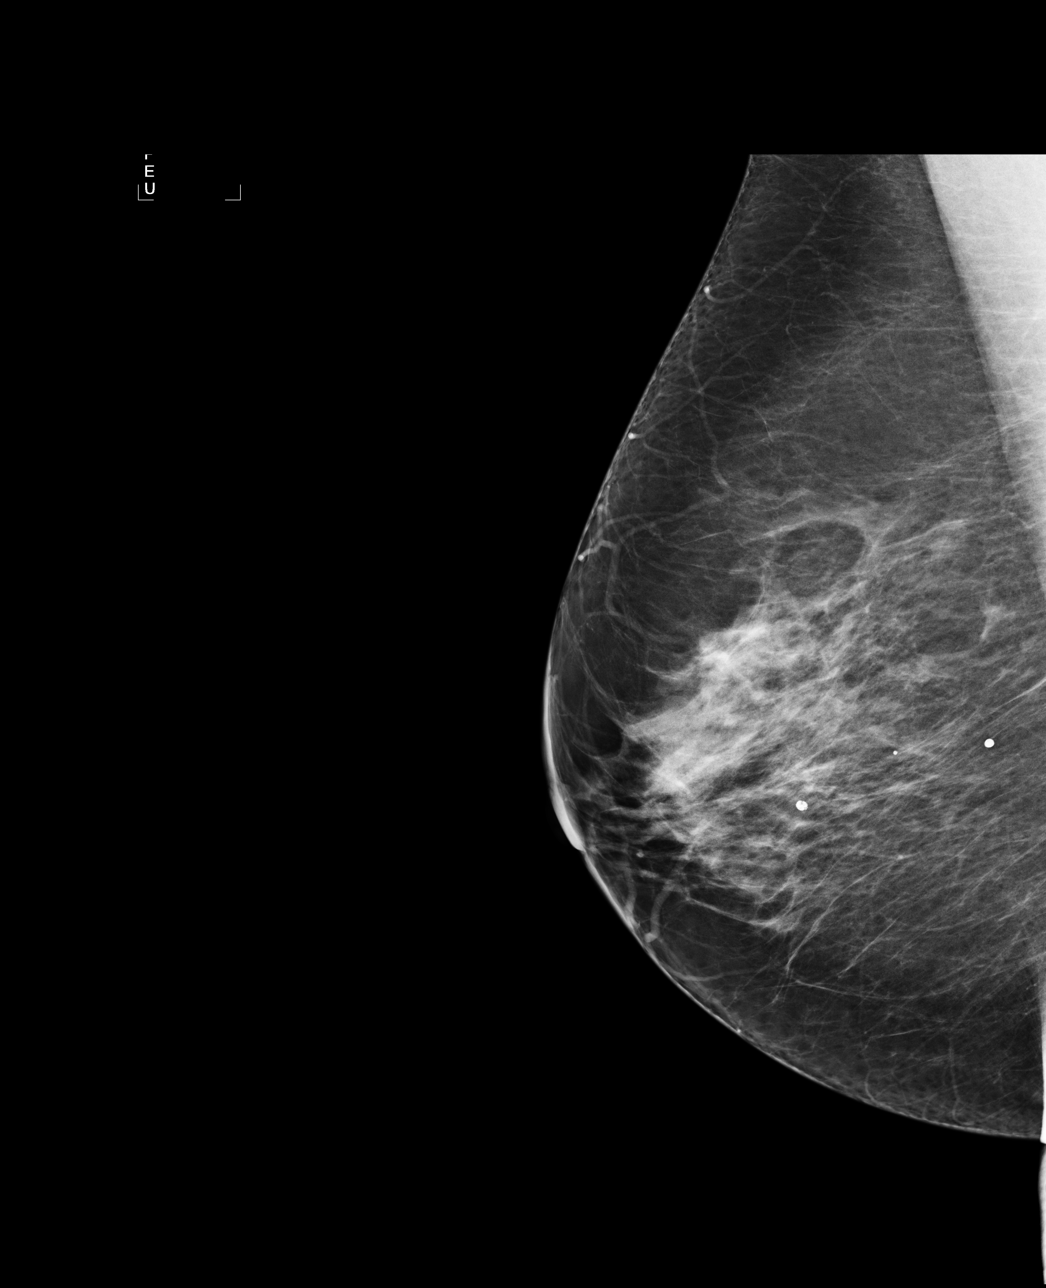

[4 of 4 positions shown; findings below may reference images not displayed]

ACR Breast Density Category c: The breast tissue is heterogeneously
dense, which may obscure small masses.
FINDINGS: There are no findings suspicious for malignancy. Images were
processed with CAD.
IMPRESSION: No mammographic evidence of malignancy. A result letter of this
screening mammogram will be mailed directly to the patient.

RECOMMENDATION:
Screening mammogram in one year. (Code:YJ-2-FEZ)

BI-RADS CATEGORY  1: Negative.

## 2015-07-29 ENCOUNTER — Other Ambulatory Visit: Payer: Self-pay | Admitting: Gastroenterology

## 2016-11-28 ENCOUNTER — Encounter: Payer: Self-pay | Admitting: Vascular Surgery

## 2016-12-06 ENCOUNTER — Ambulatory Visit (INDEPENDENT_AMBULATORY_CARE_PROVIDER_SITE_OTHER): Payer: Self-pay | Admitting: Vascular Surgery

## 2016-12-06 ENCOUNTER — Ambulatory Visit (HOSPITAL_COMMUNITY)
Admission: RE | Admit: 2016-12-06 | Discharge: 2016-12-06 | Disposition: A | Discharge status: Home / Self Care | Payer: Self-pay | Source: Ambulatory Visit | Attending: Vascular Surgery | Admitting: Vascular Surgery

## 2016-12-06 ENCOUNTER — Encounter: Payer: Self-pay | Admitting: Vascular Surgery

## 2016-12-06 VITALS — BP 132/75 | HR 66 | Temp 98.3°F | Resp 18 | Ht 63.0 in | Wt 143.0 lb

## 2016-12-06 DIAGNOSIS — H9313 Tinnitus, bilateral: Secondary | ICD-10-CM

## 2016-12-06 DIAGNOSIS — R42 Dizziness and giddiness: Secondary | ICD-10-CM | POA: Insufficient documentation

## 2016-12-06 LAB — VAS US CAROTID
LCCADDIAS: 20 cm/s
LEFT ECA DIAS: -14 cm/s
LICAPDIAS: -35 cm/s
Left CCA dist sys: 77 cm/s
Left CCA prox dias: 25 cm/s
Left CCA prox sys: 102 cm/s
Left ICA dist dias: -32 cm/s
Left ICA dist sys: -97 cm/s
Left ICA prox sys: -106 cm/s
RCCADSYS: -91 cm/s
RIGHT CCA MID DIAS: 21 cm/s
RIGHT ECA DIAS: -12 cm/s
Right CCA prox dias: 22 cm/s
Right CCA prox sys: 91 cm/s

## 2016-12-06 NOTE — Progress Notes (Signed)
[facility] [place]  Patient name: Candice Ramirez MRN: [id number] DOB: [birth year] Sex: female  REASON FOR VISIT: evaluate pulsatile tinnitus, referred by Brett Fairy, PA w/ [facility]  HPI: Candice Ramirez is a 55 y.o. female, who presents with a 9 month history of continuous ringing in the left ear. 5 years ago, the patient had the same symptoms. An acoustic neuroma was discovered and she had excision of this. Her symptoms resolved for 5 years. The patient is having some hearing loss in the left ear. She occasionally states that she will have ringing in her right ear as well. She feels that there is throbbing deep in her ear. The patient saw [facility] and had an MRI and MRA head revealing no mass of the internal auditory canals. Her MRI was normal with no large vessel occlusion or significant stenosis intracranially.    The patient has a prior history of ACDF 2 in 2013 2004. Following this, she reports that the left side of her body is more week. She does denies any episodes of amaurosis fugax, sudden onset weakness or numbness of her extremities and expressive aphasia. She denies a prior history of TIA or stroke. She has no history of cardiac issues. She has hyperlipidemia on a statin.  She recently lost her husband and is now smoking up to 1-1/2 packs per day. She has anxiety and depression.  Past Medical History:  Diagnosis Date  . Anxiety   . Cancer [facility])    breast cancer  . Depression   . Hypothyroidism   . Neuromuscular disorder ([place])    neuropathy    No family history on file.  SOCIAL HISTORY: Social History   Social History  . Marital status: Married    Spouse name: N/A  . Number of children: N/A  . Years of education: N/A   Occupational History  . Not on file.   Social History Main Topics  . Smoking status: Current Every Day Smoker    Packs/day: 1.50    Types: Cigarettes  . Smokeless tobacco: Not on file  . Alcohol use Yes    Comment: occasionally  . Drug use: No  . Sexual activity: Not on file   Other Topics Concern  . Not on file   Social History Narrative  . No narrative on file    Allergies  Allergen Reactions  . Azithromycin Other (See Comments)    Makes very sick  . Codeine Other (See Comments)    Makes very sick    Current Outpatient Prescriptions  Medication Sig Dispense Refill  . ALPRAZolam (XANAX) 0.5 MG tablet Take 0.5 mg by mouth at bedtime as needed for anxiety.    . citalopram (CELEXA) 20 MG tablet TK 1 T PO  QD  5  . EST ESTROGENS-METHYLTEST HS 0.625-1.25 MG tablet TK 1 T PO QD  5  . fexofenadine (ALLEGRA) 180 MG tablet Take 180 mg by mouth daily.    Marland Kitchen ibuprofen (ADVIL,MOTRIN) 800 MG tablet Take 1 tablet (800 mg total) by mouth every 8 (eight) hours as needed for moderate pain (mild pain). 30 tablet 1  . levothyroxine (SYNTHROID, LEVOTHROID) 100 MCG tablet TK 1 T PO D  3  . pregabalin (LYRICA) 75 MG capsule Take 75 mg by mouth daily.     . simvastatin (ZOCOR) 80 MG tablet Take 80 mg by mouth daily.    . traMADol (ULTRAM) 50 MG tablet Take 50 mg by mouth 2 (two) times  daily.     No current facility-administered medications for this visit.     REVIEW OF SYSTEMS:  [X]  denotes positive finding, [ ]  denotes negative finding Cardiac  Comments:  Chest pain or chest pressure:    Shortness of breath upon exertion:    Short of breath when lying flat:    Irregular heart rhythm:        Vascular    Pain in calf, thigh, or hip brought on by ambulation:    Pain in feet at night that wakes you up from your sleep:     Blood clot in your veins:    Leg swelling:         Pulmonary    Oxygen at home:    Productive cough:     Wheezing:         Neurologic    Sudden weakness in arms or legs:     Sudden numbness in arms or legs:     Sudden onset of difficulty speaking or slurred speech:    Temporary loss of vision in one eye:     Problems with dizziness:         Gastrointestinal      Blood in stool:     Vomited blood:         Genitourinary    Burning when urinating:     Blood in urine:        Psychiatric    Major depression:         Hematologic    Bleeding problems:    Problems with blood clotting too easily:        Skin    Rashes or ulcers:        Constitutional    Fever or chills:      PHYSICAL EXAM: Vitals:   12/06/16 1345 12/06/16 1349  BP: 136/68 132/75  Pulse: 68 66  Resp: 18   Temp: 98.3 F (36.8 C)   TempSrc: Oral   SpO2: 99%   Weight: 143 lb (64.9 kg)   Height: 5\' 3"  (1.6 m)     GENERAL: The patient is a well-nourished female, in no acute distress. She appears anxious and her hands are trembling. The vital signs are documented above. HEENT: NCAT, PERRLA, EOMI, hearing grossly intact, midline trachea, supple neck, CARDIAC: There is a regular rate and rhythm. No carotid bruits. VASCULAR: Carotid arteries are palpable, no obvious pulsatile segment on observation,  2+ radial pulses equal and symmetric. PULMONARY: There is good air exchange bilaterally without wheezing or rales..  MUSCULOSKELETAL: There are no major deformities or cyanosis. NEUROLOGIC: No focal weakness or paresthesias are detected. SKIN: There are no ulcers or rashes noted. PSYCHIATRIC: The patient has a normal affect. LYMPH: No Cervical, Axillary, or Inguinal lymphadenopathy   DATA:  Carotid duplex 12/06/2016  <40% ICA stenosis bilaterally Vertebral arteries are patent with antegrade flow bilaterally   MEDICAL ISSUES: Tinnitus left ear  -Based on today's carotid duplex, the patient's symptoms are not of vascular etiology. She has insignificant stenosis (<40%) of the bilateral internal carotid arteries. -Will refer back to ENT for further evaluation. -She does not need to have her carotid arteries routinely evaluated. She will follow up on an as-needed basis.    Virgina Jock, PA-C [facility] [place] [phone]   Addendum  I have  independently interviewed and examined the patient, and I agree with the physician assistant's findings.  Pt has no evidence of enlarge tortuous carotids.  Her carotid  duplex exam is normal.  There is need for vascular surgical intervention in this patient.  There is no obvious vascular etiology for tinnitus.  Adele Barthel, MD, FACS [facility] [place] Office: [phone] Pager: [phone]  12/06/2016, 3:01 PM

## 2018-12-04 DIAGNOSIS — K589 Irritable bowel syndrome without diarrhea: Secondary | ICD-10-CM | POA: Diagnosis not present

## 2018-12-04 DIAGNOSIS — G43909 Migraine, unspecified, not intractable, without status migrainosus: Secondary | ICD-10-CM | POA: Diagnosis not present

## 2018-12-04 DIAGNOSIS — Z01818 Encounter for other preprocedural examination: Secondary | ICD-10-CM | POA: Diagnosis not present

## 2018-12-04 DIAGNOSIS — J309 Allergic rhinitis, unspecified: Secondary | ICD-10-CM | POA: Diagnosis not present

## 2018-12-23 DIAGNOSIS — G43909 Migraine, unspecified, not intractable, without status migrainosus: Secondary | ICD-10-CM | POA: Diagnosis not present

## 2018-12-23 DIAGNOSIS — M4722 Other spondylosis with radiculopathy, cervical region: Secondary | ICD-10-CM | POA: Diagnosis not present

## 2018-12-23 DIAGNOSIS — J309 Allergic rhinitis, unspecified: Secondary | ICD-10-CM | POA: Diagnosis not present

## 2018-12-23 DIAGNOSIS — M159 Polyosteoarthritis, unspecified: Secondary | ICD-10-CM | POA: Diagnosis not present

## 2018-12-23 DIAGNOSIS — E7800 Pure hypercholesterolemia, unspecified: Secondary | ICD-10-CM | POA: Diagnosis not present

## 2018-12-23 DIAGNOSIS — K589 Irritable bowel syndrome without diarrhea: Secondary | ICD-10-CM | POA: Diagnosis not present

## 2018-12-25 DIAGNOSIS — R8290 Unspecified abnormal findings in urine: Secondary | ICD-10-CM | POA: Diagnosis not present

## 2018-12-25 DIAGNOSIS — Z01818 Encounter for other preprocedural examination: Secondary | ICD-10-CM | POA: Diagnosis not present

## 2018-12-30 DIAGNOSIS — T84216A Breakdown (mechanical) of internal fixation device of vertebrae, initial encounter: Secondary | ICD-10-CM | POA: Diagnosis not present

## 2018-12-30 DIAGNOSIS — M50123 Cervical disc disorder at C6-C7 level with radiculopathy: Secondary | ICD-10-CM | POA: Diagnosis not present

## 2018-12-30 DIAGNOSIS — M960 Pseudarthrosis after fusion or arthrodesis: Secondary | ICD-10-CM | POA: Diagnosis not present

## 2018-12-30 DIAGNOSIS — Z981 Arthrodesis status: Secondary | ICD-10-CM | POA: Diagnosis not present

## 2018-12-30 DIAGNOSIS — M5416 Radiculopathy, lumbar region: Secondary | ICD-10-CM | POA: Diagnosis not present

## 2018-12-30 DIAGNOSIS — M4322 Fusion of spine, cervical region: Secondary | ICD-10-CM | POA: Diagnosis not present

## 2018-12-30 DIAGNOSIS — M5001 Cervical disc disorder with myelopathy,  high cervical region: Secondary | ICD-10-CM | POA: Diagnosis not present

## 2018-12-30 DIAGNOSIS — M4712 Other spondylosis with myelopathy, cervical region: Secondary | ICD-10-CM | POA: Diagnosis not present

## 2018-12-31 DIAGNOSIS — M5416 Radiculopathy, lumbar region: Secondary | ICD-10-CM | POA: Diagnosis not present

## 2018-12-31 DIAGNOSIS — M960 Pseudarthrosis after fusion or arthrodesis: Secondary | ICD-10-CM | POA: Diagnosis not present

## 2018-12-31 DIAGNOSIS — Z981 Arthrodesis status: Secondary | ICD-10-CM | POA: Diagnosis not present

## 2018-12-31 DIAGNOSIS — M479 Spondylosis, unspecified: Secondary | ICD-10-CM | POA: Diagnosis not present

## 2018-12-31 DIAGNOSIS — M4322 Fusion of spine, cervical region: Secondary | ICD-10-CM | POA: Diagnosis not present

## 2019-01-30 DIAGNOSIS — M5001 Cervical disc disorder with myelopathy,  high cervical region: Secondary | ICD-10-CM | POA: Diagnosis not present

## 2019-01-30 DIAGNOSIS — M542 Cervicalgia: Secondary | ICD-10-CM | POA: Diagnosis not present

## 2019-01-30 DIAGNOSIS — M960 Pseudarthrosis after fusion or arthrodesis: Secondary | ICD-10-CM | POA: Diagnosis not present

## 2019-01-30 DIAGNOSIS — Z4689 Encounter for fitting and adjustment of other specified devices: Secondary | ICD-10-CM | POA: Diagnosis not present

## 2019-01-30 DIAGNOSIS — Z79899 Other long term (current) drug therapy: Secondary | ICD-10-CM | POA: Diagnosis not present

## 2019-01-30 DIAGNOSIS — Z79891 Long term (current) use of opiate analgesic: Secondary | ICD-10-CM | POA: Diagnosis not present

## 2019-01-30 DIAGNOSIS — M50123 Cervical disc disorder at C6-C7 level with radiculopathy: Secondary | ICD-10-CM | POA: Diagnosis not present

## 2019-01-30 DIAGNOSIS — G894 Chronic pain syndrome: Secondary | ICD-10-CM | POA: Diagnosis not present

## 2019-02-20 DIAGNOSIS — G43909 Migraine, unspecified, not intractable, without status migrainosus: Secondary | ICD-10-CM | POA: Diagnosis not present

## 2019-02-20 DIAGNOSIS — K589 Irritable bowel syndrome without diarrhea: Secondary | ICD-10-CM | POA: Diagnosis not present

## 2019-02-20 DIAGNOSIS — J309 Allergic rhinitis, unspecified: Secondary | ICD-10-CM | POA: Diagnosis not present

## 2019-02-20 DIAGNOSIS — M159 Polyosteoarthritis, unspecified: Secondary | ICD-10-CM | POA: Diagnosis not present

## 2019-02-20 DIAGNOSIS — E7800 Pure hypercholesterolemia, unspecified: Secondary | ICD-10-CM | POA: Diagnosis not present

## 2019-02-27 DIAGNOSIS — M5106 Intervertebral disc disorders with myelopathy, lumbar region: Secondary | ICD-10-CM | POA: Diagnosis not present

## 2019-04-01 DIAGNOSIS — M4322 Fusion of spine, cervical region: Secondary | ICD-10-CM | POA: Diagnosis not present

## 2019-04-01 DIAGNOSIS — M7552 Bursitis of left shoulder: Secondary | ICD-10-CM | POA: Diagnosis not present

## 2019-04-30 DIAGNOSIS — Z981 Arthrodesis status: Secondary | ICD-10-CM | POA: Diagnosis not present

## 2019-05-01 DIAGNOSIS — M4322 Fusion of spine, cervical region: Secondary | ICD-10-CM | POA: Diagnosis not present

## 2019-05-01 DIAGNOSIS — M5106 Intervertebral disc disorders with myelopathy, lumbar region: Secondary | ICD-10-CM | POA: Diagnosis not present

## 2019-05-01 DIAGNOSIS — M4722 Other spondylosis with radiculopathy, cervical region: Secondary | ICD-10-CM | POA: Diagnosis not present

## 2019-05-01 DIAGNOSIS — Z6824 Body mass index (BMI) 24.0-24.9, adult: Secondary | ICD-10-CM | POA: Diagnosis not present

## 2019-06-08 DIAGNOSIS — M7552 Bursitis of left shoulder: Secondary | ICD-10-CM | POA: Diagnosis not present

## 2019-06-08 DIAGNOSIS — M4722 Other spondylosis with radiculopathy, cervical region: Secondary | ICD-10-CM | POA: Diagnosis not present

## 2019-06-08 DIAGNOSIS — M791 Myalgia, unspecified site: Secondary | ICD-10-CM | POA: Diagnosis not present

## 2019-06-08 DIAGNOSIS — M4322 Fusion of spine, cervical region: Secondary | ICD-10-CM | POA: Diagnosis not present

## 2019-06-08 DIAGNOSIS — M5106 Intervertebral disc disorders with myelopathy, lumbar region: Secondary | ICD-10-CM | POA: Diagnosis not present

## 2019-07-13 DIAGNOSIS — N6011 Diffuse cystic mastopathy of right breast: Secondary | ICD-10-CM | POA: Diagnosis not present

## 2019-07-13 DIAGNOSIS — R928 Other abnormal and inconclusive findings on diagnostic imaging of breast: Secondary | ICD-10-CM | POA: Diagnosis not present

## 2019-07-13 DIAGNOSIS — N6320 Unspecified lump in the left breast, unspecified quadrant: Secondary | ICD-10-CM | POA: Diagnosis not present

## 2019-07-20 DIAGNOSIS — M4322 Fusion of spine, cervical region: Secondary | ICD-10-CM | POA: Diagnosis not present

## 2019-07-20 DIAGNOSIS — M5106 Intervertebral disc disorders with myelopathy, lumbar region: Secondary | ICD-10-CM | POA: Diagnosis not present

## 2019-07-20 DIAGNOSIS — M4722 Other spondylosis with radiculopathy, cervical region: Secondary | ICD-10-CM | POA: Diagnosis not present

## 2019-07-21 DIAGNOSIS — K589 Irritable bowel syndrome without diarrhea: Secondary | ICD-10-CM | POA: Diagnosis not present

## 2019-07-21 DIAGNOSIS — M159 Polyosteoarthritis, unspecified: Secondary | ICD-10-CM | POA: Diagnosis not present

## 2019-07-21 DIAGNOSIS — J309 Allergic rhinitis, unspecified: Secondary | ICD-10-CM | POA: Diagnosis not present

## 2019-07-21 DIAGNOSIS — G43909 Migraine, unspecified, not intractable, without status migrainosus: Secondary | ICD-10-CM | POA: Diagnosis not present

## 2019-08-17 NOTE — Progress Notes (Signed)
NEUROLOGY CONSULTATION NOTE  Candice Ramirez MRN: [id number] DOB: 11/24/1961  Referring provider: Karenann Cai, PA-C Primary care provider: Cher Nakai, MD  Reason for consult:  Whooshing sound in head; neck pain  HISTORY OF PRESENT ILLNESS: Candice Ramirez is a 58 year old female who presents for pulsatile tinnitus and neck pain.  History supplemented by vascular surgery and referring provider notes.   She also has history of acoustic neuroma that was resected in 2004, presenting as a cricket in her left ear ear.    It resolved but then recurred in 2016.  Due to development of new tinnitus, she had MRI/MRA of brain on 10/09/16, which was unremarkable without recurrence of acoustic neuroma or intracranial vascular abnormality.  She was found to have mild sensorineural hearing loss, so she was given hearing aids which are not effective.  Now, she notes loud ringing in both ears. It is not consistent with pulsatile tinnitus.  No recent headaches.  It keeps her up at night, despite white noise machines.    Patient has chronic neck pain.  She is status post C3-C7 ACDF in 2004, 2013 and most recently January 2020.  She has history of migraines that resolved  In 2004, following resection of the acoustic neuroma.   PAST MEDICAL HISTORY: Past Medical History:  Diagnosis Date  . Anxiety   . Cancer [facility])    breast cancer  . Depression   . Hypothyroidism   . Neuromuscular disorder ([place])    neuropathy    PAST SURGICAL HISTORY: Past Surgical History:  Procedure Laterality Date  . ABDOMINAL HYSTERECTOMY    . CHOLECYSTECTOMY    . DIAGNOSTIC LAPAROSCOPY    . GANGLION CYST EXCISION    . LAPAROSCOPIC SALPINGO OOPHERECTOMY Right 10/21/2014   Procedure: LAPAROSCOPY, CONVERT TO LAPAROTOMY WITH RIGHT SALPINGOOOPHORECTOMY;  Surgeon: Margarette Asal, MD;  Location: [facility];  Service: Gynecology;  Laterality: Right;  . NECK SURGERY  2002    MEDICATIONS: Current Outpatient Medications on File  Prior to Visit  Medication Sig Dispense Refill  . ALPRAZolam (XANAX) 0.5 MG tablet Take 0.5 mg by mouth at bedtime as needed for anxiety.    . citalopram (CELEXA) 20 MG tablet TK 1 T PO  QD  5  . EST ESTROGENS-METHYLTEST HS 0.625-1.25 MG tablet TK 1 T PO QD  5  . fexofenadine (ALLEGRA) 180 MG tablet Take 180 mg by mouth daily.    Marland Kitchen ibuprofen (ADVIL,MOTRIN) 800 MG tablet Take 1 tablet (800 mg total) by mouth every 8 (eight) hours as needed for moderate pain (mild pain). 30 tablet 1  . levothyroxine (SYNTHROID, LEVOTHROID) 100 MCG tablet TK 1 T PO D  3  . pregabalin (LYRICA) 75 MG capsule Take 75 mg by mouth daily.     . simvastatin (ZOCOR) 80 MG tablet Take 80 mg by mouth daily.    . traMADol (ULTRAM) 50 MG tablet Take 50 mg by mouth 2 (two) times daily.     No current facility-administered medications on file prior to visit.     ALLERGIES: Allergies  Allergen Reactions  . Azithromycin Other (See Comments)    Makes very sick  . Codeine Other (See Comments)    Makes very sick    FAMILY HISTORY: Family History  Problem Relation Age of Onset  . Hypertension Mother   . Heart attack Mother   . Hodgkin's lymphoma Father   . Heart attack Brother        brother died at 53  MI    SOCIAL HISTORY: Social History   Socioeconomic History  . Marital status: Married    Spouse name: Not on file  . Number of children: Not on file  . Years of education: Not on file  . Highest education level: Not on file  Occupational History  . Not on file  Social Needs  . Financial resource strain: Not on file  . Food insecurity    Worry: Not on file    Inability: Not on file  . Transportation needs    Medical: Not on file    Non-medical: Not on file  Tobacco Use  . Smoking status: Current Every Day Smoker    Packs/day: 1.50    Types: Cigarettes  Substance and Sexual Activity  . Alcohol use: Yes    Comment: occasionally  . Drug use: No  . Sexual activity: Not on file  Lifestyle  .  Physical activity    Days per week: Not on file    Minutes per session: Not on file  . Stress: Not on file  Relationships  . Social Herbalist on phone: Not on file    Gets together: Not on file    Attends religious service: Not on file    Active member of club or organization: Not on file    Attends meetings of clubs or organizations: Not on file    Relationship status: Not on file  . Intimate partner violence    Fear of current or ex partner: Not on file    Emotionally abused: Not on file    Physically abused: Not on file    Forced sexual activity: Not on file  Other Topics Concern  . Not on file  Social History Narrative  . Not on file    REVIEW OF SYSTEMS: Constitutional: No fevers, chills, or sweats, no generalized fatigue, change in appetite Eyes: No visual changes, double vision, eye pain Ear, nose and throat: No hearing loss, ear pain, nasal congestion, sore throat Cardiovascular: No chest pain, palpitations Respiratory:  No shortness of breath at rest or with exertion, wheezes GastrointestinaI: No nausea, vomiting, diarrhea, abdominal pain, fecal incontinence Genitourinary:  No dysuria, urinary retention or frequency Musculoskeletal:  Mild neck pain, back pain Integumentary: No rash, pruritus, skin lesions Neurological: as above Psychiatric: No depression, insomnia, anxiety Endocrine: No palpitations, fatigue, diaphoresis, mood swings, change in appetite, change in weight, increased thirst Hematologic/Lymphatic:  No purpura, petechiae. Allergic/Immunologic: no itchy/runny eyes, nasal congestion, recent allergic reactions, rashes  PHYSICAL EXAM: Blood pressure (!) 150/75, pulse 76, temperature 98 F (36.7 C), height 5\' 3"  (1.6 m), weight 173 lb 6.4 oz (78.7 kg), SpO2 98 %. General: No acute distress.  Patient appears well-groomed Head:  Normocephalic/atraumatic Eyes:  fundi examined but not visualized Neck: supple, no paraspinal tenderness, full range of  motion Back: No paraspinal tenderness Heart: regular rate and rhythm Lungs: Clear to auscultation bilaterally. Vascular: No carotid bruits. Neurological Exam: Mental status: alert and oriented to person, place, and time, recent and remote memory intact, fund of knowledge intact, attention and concentration intact, speech fluent and not dysarthric, language intact. Cranial nerves: CN I: not tested CN II: pupils equal, round and reactive to light, visual fields intact CN III, IV, VI:  full range of motion, no nystagmus, no ptosis CN V: facial sensation intact CN VII: upper and lower face symmetric CN VIII: hearing intact CN IX, X: gag intact, uvula midline CN XI: sternocleidomastoid and trapezius muscles intact CN  XII: tongue midline Bulk & Tone: normal, no fasciculations. Motor:  5/5 throughout  Sensation:  temperature and vibration sensation intact. Deep Tendon Reflexes:  2+ throughout, toes downgoing.   Finger to nose testing:  Without dysmetria.   Heel to shin:  Without dysmetria.   Gait:  Normal station and stride.  Able to turn and tandem walk with slight unsteadiness. Romberg negative.  IMPRESSION: Bilateral tinnitus.  Description is more consistent with classic tinnitus rather than pulsatile tinnitus.  She has had a thorough workup, including MRI/MRA of head.  Unfortunately, this may be a chronic issue.  PLAN: 1.  Consider PCP to refer to therapist for cognitive behavioral therapy. 2.  I also directed patient that there may be a tinnitus clinic at [facility]. 3.  Follow up as needed.  Thank you for allowing me to take part in the care of this patient.  Metta Clines, DO  CC:  Karenann Cai, PA-C  Cher Nakai, MD

## 2019-08-18 ENCOUNTER — Ambulatory Visit (INDEPENDENT_AMBULATORY_CARE_PROVIDER_SITE_OTHER): Payer: Medicare Other | Admitting: Neurology

## 2019-08-18 ENCOUNTER — Encounter: Payer: Self-pay | Admitting: Neurology

## 2019-08-18 ENCOUNTER — Other Ambulatory Visit: Payer: Self-pay

## 2019-08-18 VITALS — BP 150/75 | HR 76 | Temp 98.0°F | Ht 63.0 in | Wt 173.4 lb

## 2019-08-18 DIAGNOSIS — G43909 Migraine, unspecified, not intractable, without status migrainosus: Secondary | ICD-10-CM | POA: Diagnosis not present

## 2019-08-18 DIAGNOSIS — H9313 Tinnitus, bilateral: Secondary | ICD-10-CM | POA: Diagnosis not present

## 2019-08-18 DIAGNOSIS — K589 Irritable bowel syndrome without diarrhea: Secondary | ICD-10-CM | POA: Diagnosis not present

## 2019-08-18 DIAGNOSIS — J309 Allergic rhinitis, unspecified: Secondary | ICD-10-CM | POA: Diagnosis not present

## 2019-08-18 DIAGNOSIS — M159 Polyosteoarthritis, unspecified: Secondary | ICD-10-CM | POA: Diagnosis not present

## 2019-08-18 NOTE — Patient Instructions (Signed)
Unfortunately, there isn't always a specific treatment or cure for tinnitus.   1.  Consider asking your PCP for referral to see a therapist who performs cognitive behavioral therapy. 2.  Consider looking up the Tinnitus Clinic at [facility]   Tinnitus Tinnitus refers to hearing a sound when there is no actual source for that sound. This is often described as ringing in the ears. However, people with this condition may hear a variety of noises, in one ear or in both ears. The sounds of tinnitus can be soft, loud, or somewhere in between. Tinnitus can last for a few seconds or can be constant for days. It may go away without treatment and come back at various times. When tinnitus is constant or happens often, it can lead to other problems, such as trouble sleeping and trouble concentrating. Almost everyone experiences tinnitus at some point. Tinnitus that is long-lasting (chronic) or comes back often (recurs) may require medical attention. What are the causes? The cause of tinnitus is often not known. In some cases, it can result from other problems or conditions, including:  Exposure to loud noises from machinery, music, or other sources.  Hearing loss.  Ear or sinus infections.  Earwax buildup.  An object (foreign body) stuck in the ear.  Taking certain medicines.  Drinking alcohol or caffeine.  High blood pressure.  Heart diseases.  Anemia.  Allergies.  Meniere's disease.  Thyroid problems.  Tumors.  A weak, bulging blood vessel (aneurysm) near the ear.  Depression or other mood disorders. What are the signs or symptoms? The main symptom of tinnitus is hearing a sound when there is no source for that sound. It may sound like:  Buzzing.  Roaring.  Ringing.  Blowing air, like the sound heard when you listen to a seashell.  Hissing.  Whistling.  Sizzling.  Humming.  Running water.  A musical note.  Tapping. Symptoms may affect only one ear (unilateral) or  both ears (bilateral). How is this diagnosed? Tinnitus is diagnosed based on your symptoms, your medical history, and a physical exam. Your health care provider may do a thorough hearing test (audiologic exam) if your tinnitus:  Is unilateral.  Causes hearing difficulties.  Lasts 6 months or longer. You may work with a health care provider who specializes in hearing disorders (audiologist). You may be asked questions about your symptoms and how they affect your daily life. You may have other tests done, such as:  CT scan.  MRI.  An imaging test of how blood flows through your blood vessels (angiogram). How is this treated? Treating an underlying medical condition can sometimes make tinnitus go away. If your tinnitus continues, other treatments may include:  Medicines, such as antidepressants or sleeping aids.  Sound generators to mask the tinnitus. These include: ? Tabletop sound machines that play relaxing sounds to help you fall asleep. ? Wearable devices that fit in your ear and play sounds or music. ? Acoustic neural stimulation. This involves using headphones to listen to music that contains an auditory signal. Over time, listening to this signal may change some pathways in your brain and make you less sensitive to tinnitus. This treatment is used for very severe cases when no other treatment is working.  Therapy and counseling to help you manage the stress of living with tinnitus.  Using hearing aids or cochlear implants if your tinnitus is related to hearing loss. Hearing aids are worn in the outer ear. Cochlear implants are surgically placed in the inner  ear. Follow these instructions at home: Managing symptoms      When possible, avoid being in loud places and being exposed to loud sounds.  Wear hearing protection, such as earplugs, when you are exposed to loud noises.  Use a white noise machine, a humidifier, or other devices to mask the sound of tinnitus.  Practice  techniques for reducing stress, such as meditation, yoga, or deep breathing. Work with your health care provider if you need help with managing stress.  Sleep with your head slightly raised. This may reduce the impact of tinnitus. General instructions  Do not use stimulants, such as nicotine, alcohol, or caffeine. Talk with your health care provider about other stimulants to avoid. Stimulants are substances that can make you feel alert and attentive by increasing certain activities in the body (such as heart rate and blood pressure). These substances may make tinnitus worse.  Take over-the-counter and prescription medicines only as told by your health care provider.  Try to get plenty of sleep each night.  Keep all follow-up visits as told by your health care provider. This is important. Contact a health care provider if:  Your tinnitus continues for 3 weeks or longer without stopping.  Your symptoms get worse or do not get better with home care.  You develop tinnitus after a head injury.  You have tinnitus along with any of the following: ? Dizziness. ? Loss of balance. ? Nausea and vomiting. Summary  Tinnitus refers to hearing a sound when there is no actual source for that sound. This is often described as ringing in the ears.  Symptoms may affect only one ear (unilateral) or both ears (bilateral).  Use a white noise machine, a humidifier, or other devices to mask the sound of tinnitus.  Do not use stimulants, such as nicotine, alcohol, or caffeine. Talk with your health care provider about other stimulants to avoid. These substances may make tinnitus worse. This information is not intended to replace advice given to you by your health care provider. Make sure you discuss any questions you have with your health care provider. Document Released: 12/10/2005 Document Revised: 11/22/2017 Document Reviewed: 09/19/2017 Elsevier Patient Education  2020 [employer].

## 2019-09-15 DIAGNOSIS — M159 Polyosteoarthritis, unspecified: Secondary | ICD-10-CM | POA: Diagnosis not present

## 2019-09-15 DIAGNOSIS — K589 Irritable bowel syndrome without diarrhea: Secondary | ICD-10-CM | POA: Diagnosis not present

## 2019-09-15 DIAGNOSIS — G43909 Migraine, unspecified, not intractable, without status migrainosus: Secondary | ICD-10-CM | POA: Diagnosis not present

## 2019-09-15 DIAGNOSIS — J309 Allergic rhinitis, unspecified: Secondary | ICD-10-CM | POA: Diagnosis not present

## 2019-09-17 DIAGNOSIS — K589 Irritable bowel syndrome without diarrhea: Secondary | ICD-10-CM | POA: Diagnosis not present

## 2019-09-17 DIAGNOSIS — J309 Allergic rhinitis, unspecified: Secondary | ICD-10-CM | POA: Diagnosis not present

## 2019-09-17 DIAGNOSIS — G43909 Migraine, unspecified, not intractable, without status migrainosus: Secondary | ICD-10-CM | POA: Diagnosis not present

## 2019-09-17 DIAGNOSIS — H9319 Tinnitus, unspecified ear: Secondary | ICD-10-CM | POA: Diagnosis not present

## 2019-09-17 DIAGNOSIS — M549 Dorsalgia, unspecified: Secondary | ICD-10-CM | POA: Diagnosis not present

## 2019-10-13 DIAGNOSIS — M549 Dorsalgia, unspecified: Secondary | ICD-10-CM | POA: Diagnosis not present

## 2019-10-13 DIAGNOSIS — J309 Allergic rhinitis, unspecified: Secondary | ICD-10-CM | POA: Diagnosis not present

## 2019-10-13 DIAGNOSIS — G43909 Migraine, unspecified, not intractable, without status migrainosus: Secondary | ICD-10-CM | POA: Diagnosis not present

## 2019-10-13 DIAGNOSIS — K589 Irritable bowel syndrome without diarrhea: Secondary | ICD-10-CM | POA: Diagnosis not present

## 2019-10-13 DIAGNOSIS — H9319 Tinnitus, unspecified ear: Secondary | ICD-10-CM | POA: Diagnosis not present

## 2019-10-20 DIAGNOSIS — G894 Chronic pain syndrome: Secondary | ICD-10-CM | POA: Diagnosis not present

## 2019-10-20 DIAGNOSIS — M4322 Fusion of spine, cervical region: Secondary | ICD-10-CM | POA: Diagnosis not present

## 2019-10-20 DIAGNOSIS — M4722 Other spondylosis with radiculopathy, cervical region: Secondary | ICD-10-CM | POA: Diagnosis not present

## 2019-10-20 DIAGNOSIS — M5106 Intervertebral disc disorders with myelopathy, lumbar region: Secondary | ICD-10-CM | POA: Diagnosis not present

## 2019-10-20 DIAGNOSIS — M549 Dorsalgia, unspecified: Secondary | ICD-10-CM | POA: Diagnosis not present

## 2019-10-29 DIAGNOSIS — H9319 Tinnitus, unspecified ear: Secondary | ICD-10-CM | POA: Diagnosis not present

## 2019-10-30 DIAGNOSIS — M545 Low back pain: Secondary | ICD-10-CM | POA: Diagnosis not present

## 2019-10-30 DIAGNOSIS — M5106 Intervertebral disc disorders with myelopathy, lumbar region: Secondary | ICD-10-CM | POA: Diagnosis not present

## 2019-11-10 DIAGNOSIS — K589 Irritable bowel syndrome without diarrhea: Secondary | ICD-10-CM | POA: Diagnosis not present

## 2019-11-10 DIAGNOSIS — H9319 Tinnitus, unspecified ear: Secondary | ICD-10-CM | POA: Diagnosis not present

## 2019-11-10 DIAGNOSIS — G43909 Migraine, unspecified, not intractable, without status migrainosus: Secondary | ICD-10-CM | POA: Diagnosis not present

## 2019-11-10 DIAGNOSIS — J309 Allergic rhinitis, unspecified: Secondary | ICD-10-CM | POA: Diagnosis not present

## 2019-11-10 DIAGNOSIS — M549 Dorsalgia, unspecified: Secondary | ICD-10-CM | POA: Diagnosis not present

## 2019-11-25 DIAGNOSIS — M549 Dorsalgia, unspecified: Secondary | ICD-10-CM | POA: Diagnosis not present

## 2019-11-25 DIAGNOSIS — J209 Acute bronchitis, unspecified: Secondary | ICD-10-CM | POA: Diagnosis not present

## 2019-11-25 DIAGNOSIS — K589 Irritable bowel syndrome without diarrhea: Secondary | ICD-10-CM | POA: Diagnosis not present

## 2019-11-25 DIAGNOSIS — J309 Allergic rhinitis, unspecified: Secondary | ICD-10-CM | POA: Diagnosis not present

## 2019-11-25 DIAGNOSIS — G43909 Migraine, unspecified, not intractable, without status migrainosus: Secondary | ICD-10-CM | POA: Diagnosis not present

## 2019-11-26 DIAGNOSIS — M5136 Other intervertebral disc degeneration, lumbar region: Secondary | ICD-10-CM | POA: Diagnosis not present

## 2019-11-26 DIAGNOSIS — M4322 Fusion of spine, cervical region: Secondary | ICD-10-CM | POA: Diagnosis not present

## 2019-12-04 DIAGNOSIS — Z0000 Encounter for general adult medical examination without abnormal findings: Secondary | ICD-10-CM | POA: Diagnosis not present

## 2019-12-04 DIAGNOSIS — Z9181 History of falling: Secondary | ICD-10-CM | POA: Diagnosis not present

## 2019-12-04 DIAGNOSIS — E785 Hyperlipidemia, unspecified: Secondary | ICD-10-CM | POA: Diagnosis not present

## 2019-12-08 DIAGNOSIS — H9319 Tinnitus, unspecified ear: Secondary | ICD-10-CM | POA: Diagnosis not present

## 2019-12-08 DIAGNOSIS — G43909 Migraine, unspecified, not intractable, without status migrainosus: Secondary | ICD-10-CM | POA: Diagnosis not present

## 2019-12-08 DIAGNOSIS — M549 Dorsalgia, unspecified: Secondary | ICD-10-CM | POA: Diagnosis not present

## 2019-12-08 DIAGNOSIS — K589 Irritable bowel syndrome without diarrhea: Secondary | ICD-10-CM | POA: Diagnosis not present

## 2019-12-08 DIAGNOSIS — J309 Allergic rhinitis, unspecified: Secondary | ICD-10-CM | POA: Diagnosis not present

## 2019-12-30 DIAGNOSIS — M4322 Fusion of spine, cervical region: Secondary | ICD-10-CM | POA: Diagnosis not present

## 2019-12-30 DIAGNOSIS — M4722 Other spondylosis with radiculopathy, cervical region: Secondary | ICD-10-CM | POA: Diagnosis not present

## 2019-12-30 DIAGNOSIS — M5106 Intervertebral disc disorders with myelopathy, lumbar region: Secondary | ICD-10-CM | POA: Diagnosis not present

## 2020-01-05 DIAGNOSIS — M549 Dorsalgia, unspecified: Secondary | ICD-10-CM | POA: Diagnosis not present

## 2020-01-05 DIAGNOSIS — J309 Allergic rhinitis, unspecified: Secondary | ICD-10-CM | POA: Diagnosis not present

## 2020-01-05 DIAGNOSIS — H9319 Tinnitus, unspecified ear: Secondary | ICD-10-CM | POA: Diagnosis not present

## 2020-01-05 DIAGNOSIS — K589 Irritable bowel syndrome without diarrhea: Secondary | ICD-10-CM | POA: Diagnosis not present

## 2020-01-05 DIAGNOSIS — G43909 Migraine, unspecified, not intractable, without status migrainosus: Secondary | ICD-10-CM | POA: Diagnosis not present

## 2020-01-06 DIAGNOSIS — M5136 Other intervertebral disc degeneration, lumbar region: Secondary | ICD-10-CM | POA: Diagnosis not present

## 2020-02-03 DIAGNOSIS — H9319 Tinnitus, unspecified ear: Secondary | ICD-10-CM | POA: Diagnosis not present

## 2020-02-03 DIAGNOSIS — G43909 Migraine, unspecified, not intractable, without status migrainosus: Secondary | ICD-10-CM | POA: Diagnosis not present

## 2020-02-03 DIAGNOSIS — M549 Dorsalgia, unspecified: Secondary | ICD-10-CM | POA: Diagnosis not present

## 2020-02-03 DIAGNOSIS — K589 Irritable bowel syndrome without diarrhea: Secondary | ICD-10-CM | POA: Diagnosis not present

## 2020-02-03 DIAGNOSIS — J309 Allergic rhinitis, unspecified: Secondary | ICD-10-CM | POA: Diagnosis not present

## 2020-02-23 DIAGNOSIS — G894 Chronic pain syndrome: Secondary | ICD-10-CM | POA: Diagnosis not present

## 2020-02-23 DIAGNOSIS — M5136 Other intervertebral disc degeneration, lumbar region: Secondary | ICD-10-CM | POA: Diagnosis not present

## 2020-02-23 DIAGNOSIS — Z79891 Long term (current) use of opiate analgesic: Secondary | ICD-10-CM | POA: Diagnosis not present

## 2020-02-23 DIAGNOSIS — M4722 Other spondylosis with radiculopathy, cervical region: Secondary | ICD-10-CM | POA: Diagnosis not present

## 2020-02-23 DIAGNOSIS — M4322 Fusion of spine, cervical region: Secondary | ICD-10-CM | POA: Diagnosis not present

## 2020-02-23 DIAGNOSIS — Z79899 Other long term (current) drug therapy: Secondary | ICD-10-CM | POA: Diagnosis not present

## 2020-02-29 DIAGNOSIS — M5136 Other intervertebral disc degeneration, lumbar region: Secondary | ICD-10-CM | POA: Diagnosis not present

## 2020-03-02 DIAGNOSIS — M549 Dorsalgia, unspecified: Secondary | ICD-10-CM | POA: Diagnosis not present

## 2020-03-02 DIAGNOSIS — E039 Hypothyroidism, unspecified: Secondary | ICD-10-CM | POA: Diagnosis not present

## 2020-03-02 DIAGNOSIS — J309 Allergic rhinitis, unspecified: Secondary | ICD-10-CM | POA: Diagnosis not present

## 2020-03-02 DIAGNOSIS — E7800 Pure hypercholesterolemia, unspecified: Secondary | ICD-10-CM | POA: Diagnosis not present

## 2020-03-02 DIAGNOSIS — K589 Irritable bowel syndrome without diarrhea: Secondary | ICD-10-CM | POA: Diagnosis not present

## 2020-03-02 DIAGNOSIS — G43909 Migraine, unspecified, not intractable, without status migrainosus: Secondary | ICD-10-CM | POA: Diagnosis not present

## 2020-03-11 DIAGNOSIS — J309 Allergic rhinitis, unspecified: Secondary | ICD-10-CM | POA: Diagnosis not present

## 2020-03-11 DIAGNOSIS — K589 Irritable bowel syndrome without diarrhea: Secondary | ICD-10-CM | POA: Diagnosis not present

## 2020-03-11 DIAGNOSIS — G43909 Migraine, unspecified, not intractable, without status migrainosus: Secondary | ICD-10-CM | POA: Diagnosis not present

## 2020-03-11 DIAGNOSIS — M549 Dorsalgia, unspecified: Secondary | ICD-10-CM | POA: Diagnosis not present

## 2020-03-11 DIAGNOSIS — E7800 Pure hypercholesterolemia, unspecified: Secondary | ICD-10-CM | POA: Diagnosis not present

## 2020-03-11 DIAGNOSIS — H9319 Tinnitus, unspecified ear: Secondary | ICD-10-CM | POA: Diagnosis not present

## 2020-03-16 DIAGNOSIS — M4322 Fusion of spine, cervical region: Secondary | ICD-10-CM | POA: Diagnosis not present

## 2020-03-16 DIAGNOSIS — M5106 Intervertebral disc disorders with myelopathy, lumbar region: Secondary | ICD-10-CM | POA: Diagnosis not present

## 2020-03-16 DIAGNOSIS — M4722 Other spondylosis with radiculopathy, cervical region: Secondary | ICD-10-CM | POA: Diagnosis not present

## 2020-03-16 DIAGNOSIS — G894 Chronic pain syndrome: Secondary | ICD-10-CM | POA: Diagnosis not present

## 2020-04-01 DIAGNOSIS — G43909 Migraine, unspecified, not intractable, without status migrainosus: Secondary | ICD-10-CM | POA: Diagnosis not present

## 2020-04-01 DIAGNOSIS — H9319 Tinnitus, unspecified ear: Secondary | ICD-10-CM | POA: Diagnosis not present

## 2020-04-01 DIAGNOSIS — J309 Allergic rhinitis, unspecified: Secondary | ICD-10-CM | POA: Diagnosis not present

## 2020-04-01 DIAGNOSIS — M549 Dorsalgia, unspecified: Secondary | ICD-10-CM | POA: Diagnosis not present

## 2020-04-01 DIAGNOSIS — K589 Irritable bowel syndrome without diarrhea: Secondary | ICD-10-CM | POA: Diagnosis not present

## 2020-04-08 DIAGNOSIS — K589 Irritable bowel syndrome without diarrhea: Secondary | ICD-10-CM | POA: Diagnosis not present

## 2020-04-08 DIAGNOSIS — J309 Allergic rhinitis, unspecified: Secondary | ICD-10-CM | POA: Diagnosis not present

## 2020-04-08 DIAGNOSIS — G43909 Migraine, unspecified, not intractable, without status migrainosus: Secondary | ICD-10-CM | POA: Diagnosis not present

## 2020-04-08 DIAGNOSIS — H9319 Tinnitus, unspecified ear: Secondary | ICD-10-CM | POA: Diagnosis not present

## 2020-04-13 DIAGNOSIS — M4722 Other spondylosis with radiculopathy, cervical region: Secondary | ICD-10-CM | POA: Diagnosis not present

## 2020-04-13 DIAGNOSIS — M5106 Intervertebral disc disorders with myelopathy, lumbar region: Secondary | ICD-10-CM | POA: Diagnosis not present

## 2020-04-13 DIAGNOSIS — M4322 Fusion of spine, cervical region: Secondary | ICD-10-CM | POA: Diagnosis not present

## 2020-04-13 DIAGNOSIS — G894 Chronic pain syndrome: Secondary | ICD-10-CM | POA: Diagnosis not present

## 2020-04-21 DIAGNOSIS — J309 Allergic rhinitis, unspecified: Secondary | ICD-10-CM | POA: Diagnosis not present

## 2020-04-21 DIAGNOSIS — G43909 Migraine, unspecified, not intractable, without status migrainosus: Secondary | ICD-10-CM | POA: Diagnosis not present

## 2020-04-21 DIAGNOSIS — M549 Dorsalgia, unspecified: Secondary | ICD-10-CM | POA: Diagnosis not present

## 2020-04-21 DIAGNOSIS — K589 Irritable bowel syndrome without diarrhea: Secondary | ICD-10-CM | POA: Diagnosis not present

## 2020-04-21 DIAGNOSIS — J208 Acute bronchitis due to other specified organisms: Secondary | ICD-10-CM | POA: Diagnosis not present

## 2020-04-26 DIAGNOSIS — J309 Allergic rhinitis, unspecified: Secondary | ICD-10-CM | POA: Diagnosis not present

## 2020-04-26 DIAGNOSIS — M549 Dorsalgia, unspecified: Secondary | ICD-10-CM | POA: Diagnosis not present

## 2020-04-26 DIAGNOSIS — J449 Chronic obstructive pulmonary disease, unspecified: Secondary | ICD-10-CM | POA: Diagnosis not present

## 2020-04-26 DIAGNOSIS — K589 Irritable bowel syndrome without diarrhea: Secondary | ICD-10-CM | POA: Diagnosis not present

## 2020-04-26 DIAGNOSIS — H9319 Tinnitus, unspecified ear: Secondary | ICD-10-CM | POA: Diagnosis not present

## 2020-05-05 DIAGNOSIS — J309 Allergic rhinitis, unspecified: Secondary | ICD-10-CM | POA: Diagnosis not present

## 2020-05-05 DIAGNOSIS — M549 Dorsalgia, unspecified: Secondary | ICD-10-CM | POA: Diagnosis not present

## 2020-05-05 DIAGNOSIS — K589 Irritable bowel syndrome without diarrhea: Secondary | ICD-10-CM | POA: Diagnosis not present

## 2020-05-05 DIAGNOSIS — G43909 Migraine, unspecified, not intractable, without status migrainosus: Secondary | ICD-10-CM | POA: Diagnosis not present

## 2020-05-05 DIAGNOSIS — J208 Acute bronchitis due to other specified organisms: Secondary | ICD-10-CM | POA: Diagnosis not present

## 2020-05-19 DIAGNOSIS — G894 Chronic pain syndrome: Secondary | ICD-10-CM | POA: Diagnosis not present

## 2020-05-19 DIAGNOSIS — M549 Dorsalgia, unspecified: Secondary | ICD-10-CM | POA: Diagnosis not present

## 2020-05-19 DIAGNOSIS — J449 Chronic obstructive pulmonary disease, unspecified: Secondary | ICD-10-CM | POA: Diagnosis not present

## 2020-05-19 DIAGNOSIS — M4322 Fusion of spine, cervical region: Secondary | ICD-10-CM | POA: Diagnosis not present

## 2020-05-19 DIAGNOSIS — G43909 Migraine, unspecified, not intractable, without status migrainosus: Secondary | ICD-10-CM | POA: Diagnosis not present

## 2020-05-19 DIAGNOSIS — M4722 Other spondylosis with radiculopathy, cervical region: Secondary | ICD-10-CM | POA: Diagnosis not present

## 2020-05-19 DIAGNOSIS — J309 Allergic rhinitis, unspecified: Secondary | ICD-10-CM | POA: Diagnosis not present

## 2020-05-31 DIAGNOSIS — M47816 Spondylosis without myelopathy or radiculopathy, lumbar region: Secondary | ICD-10-CM | POA: Diagnosis not present

## 2020-06-03 DIAGNOSIS — J309 Allergic rhinitis, unspecified: Secondary | ICD-10-CM | POA: Diagnosis not present

## 2020-06-03 DIAGNOSIS — M549 Dorsalgia, unspecified: Secondary | ICD-10-CM | POA: Diagnosis not present

## 2020-06-03 DIAGNOSIS — E7800 Pure hypercholesterolemia, unspecified: Secondary | ICD-10-CM | POA: Diagnosis not present

## 2020-06-03 DIAGNOSIS — K589 Irritable bowel syndrome without diarrhea: Secondary | ICD-10-CM | POA: Diagnosis not present

## 2020-06-03 DIAGNOSIS — G43909 Migraine, unspecified, not intractable, without status migrainosus: Secondary | ICD-10-CM | POA: Diagnosis not present

## 2020-06-03 DIAGNOSIS — J449 Chronic obstructive pulmonary disease, unspecified: Secondary | ICD-10-CM | POA: Diagnosis not present

## 2020-06-15 DIAGNOSIS — G894 Chronic pain syndrome: Secondary | ICD-10-CM | POA: Diagnosis not present

## 2020-06-15 DIAGNOSIS — M4722 Other spondylosis with radiculopathy, cervical region: Secondary | ICD-10-CM | POA: Diagnosis not present

## 2020-06-15 DIAGNOSIS — M47816 Spondylosis without myelopathy or radiculopathy, lumbar region: Secondary | ICD-10-CM | POA: Diagnosis not present

## 2020-06-29 DIAGNOSIS — M5136 Other intervertebral disc degeneration, lumbar region: Secondary | ICD-10-CM | POA: Diagnosis not present

## 2020-07-01 DIAGNOSIS — K589 Irritable bowel syndrome without diarrhea: Secondary | ICD-10-CM | POA: Diagnosis not present

## 2020-07-01 DIAGNOSIS — J449 Chronic obstructive pulmonary disease, unspecified: Secondary | ICD-10-CM | POA: Diagnosis not present

## 2020-07-01 DIAGNOSIS — J309 Allergic rhinitis, unspecified: Secondary | ICD-10-CM | POA: Diagnosis not present

## 2020-07-01 DIAGNOSIS — G43909 Migraine, unspecified, not intractable, without status migrainosus: Secondary | ICD-10-CM | POA: Diagnosis not present

## 2020-07-01 DIAGNOSIS — M549 Dorsalgia, unspecified: Secondary | ICD-10-CM | POA: Diagnosis not present

## 2020-07-15 DIAGNOSIS — M47816 Spondylosis without myelopathy or radiculopathy, lumbar region: Secondary | ICD-10-CM | POA: Diagnosis not present

## 2020-07-15 DIAGNOSIS — G894 Chronic pain syndrome: Secondary | ICD-10-CM | POA: Diagnosis not present

## 2020-07-15 DIAGNOSIS — M4722 Other spondylosis with radiculopathy, cervical region: Secondary | ICD-10-CM | POA: Diagnosis not present

## 2020-07-29 DIAGNOSIS — J449 Chronic obstructive pulmonary disease, unspecified: Secondary | ICD-10-CM | POA: Diagnosis not present

## 2020-07-29 DIAGNOSIS — E7800 Pure hypercholesterolemia, unspecified: Secondary | ICD-10-CM | POA: Diagnosis not present

## 2020-07-29 DIAGNOSIS — M549 Dorsalgia, unspecified: Secondary | ICD-10-CM | POA: Diagnosis not present

## 2020-07-29 DIAGNOSIS — G43909 Migraine, unspecified, not intractable, without status migrainosus: Secondary | ICD-10-CM | POA: Diagnosis not present

## 2020-07-29 DIAGNOSIS — J309 Allergic rhinitis, unspecified: Secondary | ICD-10-CM | POA: Diagnosis not present

## 2020-07-29 DIAGNOSIS — Z87891 Personal history of nicotine dependence: Secondary | ICD-10-CM | POA: Diagnosis not present

## 2020-08-17 DIAGNOSIS — M47816 Spondylosis without myelopathy or radiculopathy, lumbar region: Secondary | ICD-10-CM | POA: Diagnosis not present

## 2020-08-17 DIAGNOSIS — M4722 Other spondylosis with radiculopathy, cervical region: Secondary | ICD-10-CM | POA: Diagnosis not present

## 2020-08-17 DIAGNOSIS — G894 Chronic pain syndrome: Secondary | ICD-10-CM | POA: Diagnosis not present

## 2020-09-15 DIAGNOSIS — Z79899 Other long term (current) drug therapy: Secondary | ICD-10-CM | POA: Diagnosis not present

## 2020-09-15 DIAGNOSIS — G894 Chronic pain syndrome: Secondary | ICD-10-CM | POA: Diagnosis not present

## 2020-09-15 DIAGNOSIS — Z79891 Long term (current) use of opiate analgesic: Secondary | ICD-10-CM | POA: Diagnosis not present

## 2020-09-15 DIAGNOSIS — M47816 Spondylosis without myelopathy or radiculopathy, lumbar region: Secondary | ICD-10-CM | POA: Diagnosis not present

## 2020-09-15 DIAGNOSIS — M4722 Other spondylosis with radiculopathy, cervical region: Secondary | ICD-10-CM | POA: Diagnosis not present

## 2020-10-12 DIAGNOSIS — M47816 Spondylosis without myelopathy or radiculopathy, lumbar region: Secondary | ICD-10-CM | POA: Diagnosis not present

## 2020-10-12 DIAGNOSIS — M4722 Other spondylosis with radiculopathy, cervical region: Secondary | ICD-10-CM | POA: Diagnosis not present

## 2020-10-12 DIAGNOSIS — G894 Chronic pain syndrome: Secondary | ICD-10-CM | POA: Diagnosis not present

## 2021-11-27 DIAGNOSIS — M7551 Bursitis of right shoulder: Secondary | ICD-10-CM | POA: Diagnosis not present

## 2021-11-27 DIAGNOSIS — M545 Low back pain, unspecified: Secondary | ICD-10-CM | POA: Diagnosis not present

## 2021-11-27 DIAGNOSIS — G894 Chronic pain syndrome: Secondary | ICD-10-CM | POA: Diagnosis not present

## 2021-11-27 DIAGNOSIS — M4326 Fusion of spine, lumbar region: Secondary | ICD-10-CM | POA: Diagnosis not present

## 2021-12-29 DIAGNOSIS — Z79899 Other long term (current) drug therapy: Secondary | ICD-10-CM | POA: Diagnosis not present

## 2021-12-29 DIAGNOSIS — M7551 Bursitis of right shoulder: Secondary | ICD-10-CM | POA: Diagnosis not present

## 2021-12-29 DIAGNOSIS — M545 Low back pain, unspecified: Secondary | ICD-10-CM | POA: Diagnosis not present

## 2021-12-29 DIAGNOSIS — Z79891 Long term (current) use of opiate analgesic: Secondary | ICD-10-CM | POA: Diagnosis not present

## 2021-12-29 DIAGNOSIS — M542 Cervicalgia: Secondary | ICD-10-CM | POA: Diagnosis not present

## 2021-12-29 DIAGNOSIS — G894 Chronic pain syndrome: Secondary | ICD-10-CM | POA: Diagnosis not present

## 2022-01-30 DIAGNOSIS — M7551 Bursitis of right shoulder: Secondary | ICD-10-CM | POA: Diagnosis not present

## 2022-01-30 DIAGNOSIS — G894 Chronic pain syndrome: Secondary | ICD-10-CM | POA: Diagnosis not present

## 2022-01-30 DIAGNOSIS — M545 Low back pain, unspecified: Secondary | ICD-10-CM | POA: Diagnosis not present

## 2022-02-05 DIAGNOSIS — J069 Acute upper respiratory infection, unspecified: Secondary | ICD-10-CM | POA: Diagnosis not present

## 2022-02-05 DIAGNOSIS — J449 Chronic obstructive pulmonary disease, unspecified: Secondary | ICD-10-CM | POA: Diagnosis not present

## 2022-02-19 DIAGNOSIS — H9319 Tinnitus, unspecified ear: Secondary | ICD-10-CM | POA: Diagnosis not present

## 2022-02-19 DIAGNOSIS — J309 Allergic rhinitis, unspecified: Secondary | ICD-10-CM | POA: Diagnosis not present

## 2022-02-19 DIAGNOSIS — F17200 Nicotine dependence, unspecified, uncomplicated: Secondary | ICD-10-CM | POA: Diagnosis not present

## 2022-02-19 DIAGNOSIS — G43909 Migraine, unspecified, not intractable, without status migrainosus: Secondary | ICD-10-CM | POA: Diagnosis not present

## 2022-02-19 DIAGNOSIS — E039 Hypothyroidism, unspecified: Secondary | ICD-10-CM | POA: Diagnosis not present

## 2022-02-19 DIAGNOSIS — E7800 Pure hypercholesterolemia, unspecified: Secondary | ICD-10-CM | POA: Diagnosis not present

## 2022-02-19 DIAGNOSIS — J449 Chronic obstructive pulmonary disease, unspecified: Secondary | ICD-10-CM | POA: Diagnosis not present

## 2022-02-19 DIAGNOSIS — K589 Irritable bowel syndrome without diarrhea: Secondary | ICD-10-CM | POA: Diagnosis not present

## 2022-02-19 DIAGNOSIS — Z87891 Personal history of nicotine dependence: Secondary | ICD-10-CM | POA: Diagnosis not present

## 2022-02-19 DIAGNOSIS — M549 Dorsalgia, unspecified: Secondary | ICD-10-CM | POA: Diagnosis not present

## 2022-02-28 DIAGNOSIS — M4326 Fusion of spine, lumbar region: Secondary | ICD-10-CM | POA: Diagnosis not present

## 2022-02-28 DIAGNOSIS — M545 Low back pain, unspecified: Secondary | ICD-10-CM | POA: Diagnosis not present

## 2022-02-28 DIAGNOSIS — G894 Chronic pain syndrome: Secondary | ICD-10-CM | POA: Diagnosis not present

## 2022-03-27 DIAGNOSIS — J069 Acute upper respiratory infection, unspecified: Secondary | ICD-10-CM | POA: Diagnosis not present

## 2022-03-27 DIAGNOSIS — J449 Chronic obstructive pulmonary disease, unspecified: Secondary | ICD-10-CM | POA: Diagnosis not present

## 2022-04-12 DIAGNOSIS — Z6824 Body mass index (BMI) 24.0-24.9, adult: Secondary | ICD-10-CM | POA: Diagnosis not present

## 2022-04-12 DIAGNOSIS — M5106 Intervertebral disc disorders with myelopathy, lumbar region: Secondary | ICD-10-CM | POA: Diagnosis not present

## 2022-04-12 DIAGNOSIS — M4326 Fusion of spine, lumbar region: Secondary | ICD-10-CM | POA: Diagnosis not present

## 2022-04-12 DIAGNOSIS — M545 Low back pain, unspecified: Secondary | ICD-10-CM | POA: Diagnosis not present

## 2022-04-12 DIAGNOSIS — G894 Chronic pain syndrome: Secondary | ICD-10-CM | POA: Diagnosis not present

## 2022-04-17 DIAGNOSIS — J309 Allergic rhinitis, unspecified: Secondary | ICD-10-CM | POA: Diagnosis not present

## 2022-04-17 DIAGNOSIS — J069 Acute upper respiratory infection, unspecified: Secondary | ICD-10-CM | POA: Diagnosis not present

## 2022-04-17 DIAGNOSIS — E7800 Pure hypercholesterolemia, unspecified: Secondary | ICD-10-CM | POA: Diagnosis not present

## 2022-04-17 DIAGNOSIS — K589 Irritable bowel syndrome without diarrhea: Secondary | ICD-10-CM | POA: Diagnosis not present

## 2022-04-17 DIAGNOSIS — Z87891 Personal history of nicotine dependence: Secondary | ICD-10-CM | POA: Diagnosis not present

## 2022-04-17 DIAGNOSIS — M549 Dorsalgia, unspecified: Secondary | ICD-10-CM | POA: Diagnosis not present

## 2022-04-17 DIAGNOSIS — G43909 Migraine, unspecified, not intractable, without status migrainosus: Secondary | ICD-10-CM | POA: Diagnosis not present

## 2022-04-17 DIAGNOSIS — E039 Hypothyroidism, unspecified: Secondary | ICD-10-CM | POA: Diagnosis not present

## 2022-04-17 DIAGNOSIS — M159 Polyosteoarthritis, unspecified: Secondary | ICD-10-CM | POA: Diagnosis not present

## 2022-05-10 DIAGNOSIS — G894 Chronic pain syndrome: Secondary | ICD-10-CM | POA: Diagnosis not present

## 2022-05-10 DIAGNOSIS — M545 Low back pain, unspecified: Secondary | ICD-10-CM | POA: Diagnosis not present

## 2022-05-10 DIAGNOSIS — M4326 Fusion of spine, lumbar region: Secondary | ICD-10-CM | POA: Diagnosis not present

## 2022-05-10 DIAGNOSIS — M5106 Intervertebral disc disorders with myelopathy, lumbar region: Secondary | ICD-10-CM | POA: Diagnosis not present

## 2022-06-11 DIAGNOSIS — J449 Chronic obstructive pulmonary disease, unspecified: Secondary | ICD-10-CM | POA: Diagnosis not present

## 2022-06-11 DIAGNOSIS — F17200 Nicotine dependence, unspecified, uncomplicated: Secondary | ICD-10-CM | POA: Diagnosis not present

## 2022-06-11 DIAGNOSIS — M549 Dorsalgia, unspecified: Secondary | ICD-10-CM | POA: Diagnosis not present

## 2022-06-11 DIAGNOSIS — E039 Hypothyroidism, unspecified: Secondary | ICD-10-CM | POA: Diagnosis not present

## 2022-06-11 DIAGNOSIS — K589 Irritable bowel syndrome without diarrhea: Secondary | ICD-10-CM | POA: Diagnosis not present

## 2022-06-11 DIAGNOSIS — G43909 Migraine, unspecified, not intractable, without status migrainosus: Secondary | ICD-10-CM | POA: Diagnosis not present

## 2022-06-11 DIAGNOSIS — H9319 Tinnitus, unspecified ear: Secondary | ICD-10-CM | POA: Diagnosis not present

## 2022-06-11 DIAGNOSIS — M159 Polyosteoarthritis, unspecified: Secondary | ICD-10-CM | POA: Diagnosis not present

## 2022-06-11 DIAGNOSIS — Z87891 Personal history of nicotine dependence: Secondary | ICD-10-CM | POA: Diagnosis not present

## 2022-06-11 DIAGNOSIS — E7800 Pure hypercholesterolemia, unspecified: Secondary | ICD-10-CM | POA: Diagnosis not present

## 2022-06-11 DIAGNOSIS — J309 Allergic rhinitis, unspecified: Secondary | ICD-10-CM | POA: Diagnosis not present

## 2022-06-18 DIAGNOSIS — M545 Low back pain, unspecified: Secondary | ICD-10-CM | POA: Diagnosis not present

## 2022-06-18 DIAGNOSIS — M4326 Fusion of spine, lumbar region: Secondary | ICD-10-CM | POA: Diagnosis not present

## 2022-06-18 DIAGNOSIS — G894 Chronic pain syndrome: Secondary | ICD-10-CM | POA: Diagnosis not present

## 2022-07-03 DIAGNOSIS — E785 Hyperlipidemia, unspecified: Secondary | ICD-10-CM | POA: Diagnosis not present

## 2022-07-03 DIAGNOSIS — Z9181 History of falling: Secondary | ICD-10-CM | POA: Diagnosis not present

## 2022-07-03 DIAGNOSIS — Z0000 Encounter for general adult medical examination without abnormal findings: Secondary | ICD-10-CM | POA: Diagnosis not present

## 2022-07-19 DIAGNOSIS — G43909 Migraine, unspecified, not intractable, without status migrainosus: Secondary | ICD-10-CM | POA: Diagnosis not present

## 2022-07-19 DIAGNOSIS — E7800 Pure hypercholesterolemia, unspecified: Secondary | ICD-10-CM | POA: Diagnosis not present

## 2022-07-19 DIAGNOSIS — J309 Allergic rhinitis, unspecified: Secondary | ICD-10-CM | POA: Diagnosis not present

## 2022-07-19 DIAGNOSIS — H9319 Tinnitus, unspecified ear: Secondary | ICD-10-CM | POA: Diagnosis not present

## 2022-07-19 DIAGNOSIS — J449 Chronic obstructive pulmonary disease, unspecified: Secondary | ICD-10-CM | POA: Diagnosis not present

## 2022-07-19 DIAGNOSIS — M549 Dorsalgia, unspecified: Secondary | ICD-10-CM | POA: Diagnosis not present

## 2022-07-19 DIAGNOSIS — Z87891 Personal history of nicotine dependence: Secondary | ICD-10-CM | POA: Diagnosis not present

## 2022-07-19 DIAGNOSIS — M159 Polyosteoarthritis, unspecified: Secondary | ICD-10-CM | POA: Diagnosis not present

## 2022-07-19 DIAGNOSIS — K589 Irritable bowel syndrome without diarrhea: Secondary | ICD-10-CM | POA: Diagnosis not present

## 2022-07-19 DIAGNOSIS — E039 Hypothyroidism, unspecified: Secondary | ICD-10-CM | POA: Diagnosis not present

## 2022-08-09 DIAGNOSIS — Z13820 Encounter for screening for osteoporosis: Secondary | ICD-10-CM | POA: Diagnosis not present

## 2022-08-09 DIAGNOSIS — Z1231 Encounter for screening mammogram for malignant neoplasm of breast: Secondary | ICD-10-CM | POA: Diagnosis not present

## 2022-08-22 DIAGNOSIS — Z79891 Long term (current) use of opiate analgesic: Secondary | ICD-10-CM | POA: Diagnosis not present

## 2022-08-22 DIAGNOSIS — Z79899 Other long term (current) drug therapy: Secondary | ICD-10-CM | POA: Diagnosis not present

## 2022-08-22 DIAGNOSIS — M542 Cervicalgia: Secondary | ICD-10-CM | POA: Diagnosis not present

## 2022-08-22 DIAGNOSIS — M4326 Fusion of spine, lumbar region: Secondary | ICD-10-CM | POA: Diagnosis not present

## 2022-08-22 DIAGNOSIS — G894 Chronic pain syndrome: Secondary | ICD-10-CM | POA: Diagnosis not present

## 2022-08-22 DIAGNOSIS — M545 Low back pain, unspecified: Secondary | ICD-10-CM | POA: Diagnosis not present

## 2022-08-23 DIAGNOSIS — R5382 Chronic fatigue, unspecified: Secondary | ICD-10-CM | POA: Diagnosis not present

## 2022-08-23 DIAGNOSIS — K589 Irritable bowel syndrome without diarrhea: Secondary | ICD-10-CM | POA: Diagnosis not present

## 2022-08-23 DIAGNOSIS — M549 Dorsalgia, unspecified: Secondary | ICD-10-CM | POA: Diagnosis not present

## 2022-08-23 DIAGNOSIS — E039 Hypothyroidism, unspecified: Secondary | ICD-10-CM | POA: Diagnosis not present

## 2022-08-23 DIAGNOSIS — J309 Allergic rhinitis, unspecified: Secondary | ICD-10-CM | POA: Diagnosis not present

## 2022-08-23 DIAGNOSIS — M159 Polyosteoarthritis, unspecified: Secondary | ICD-10-CM | POA: Diagnosis not present

## 2022-08-23 DIAGNOSIS — J449 Chronic obstructive pulmonary disease, unspecified: Secondary | ICD-10-CM | POA: Diagnosis not present

## 2022-08-23 DIAGNOSIS — G43909 Migraine, unspecified, not intractable, without status migrainosus: Secondary | ICD-10-CM | POA: Diagnosis not present

## 2022-08-23 DIAGNOSIS — E7800 Pure hypercholesterolemia, unspecified: Secondary | ICD-10-CM | POA: Diagnosis not present

## 2022-09-17 DIAGNOSIS — M47812 Spondylosis without myelopathy or radiculopathy, cervical region: Secondary | ICD-10-CM | POA: Diagnosis not present

## 2022-09-17 DIAGNOSIS — G5623 Lesion of ulnar nerve, bilateral upper limbs: Secondary | ICD-10-CM | POA: Diagnosis not present

## 2022-09-17 DIAGNOSIS — E039 Hypothyroidism, unspecified: Secondary | ICD-10-CM | POA: Diagnosis not present

## 2022-09-17 DIAGNOSIS — M542 Cervicalgia: Secondary | ICD-10-CM | POA: Diagnosis not present

## 2023-03-28 ENCOUNTER — Encounter: Payer: Self-pay | Admitting: Vascular Surgery

## 2023-03-28 ENCOUNTER — Ambulatory Visit: Payer: Medicare Other | Admitting: Vascular Surgery

## 2023-03-28 VITALS — BP 150/78 | HR 69 | Temp 98.2°F | Resp 20 | Ht 63.0 in | Wt 174.6 lb

## 2023-03-28 DIAGNOSIS — I7102 Dissection of abdominal aorta: Secondary | ICD-10-CM

## 2023-03-28 NOTE — Progress Notes (Signed)
ASSESSMENT & PLAN   FOCAL AORTIC DISSECTION: This patient has a focal aortic dissection in the distal abdominal aorta which appears to be chronic.  It does not appear to be flow-limiting.  We have discussed the importance of tobacco cessation.  She is on a statin.  I encouraged her to begin taking 81 mg of aspirin daily.  I think we can probably follow this with ultrasound so I have ordered a follow-up a duplex of her aorta in 1 year.  Will also get baseline ABIs at that time.  Today she had palpable pedal pulses with biphasic signals.  She will be seen on the PA schedule at that time.  REASON FOR CONSULT:    Focal aortic dissection.  The consult is requested by Dr. Cher Nakai.   HPI:   Candice Ramirez is a 62 y.o. female who is having some upper abdominal pain or chest pain which prompted a CT scan of the abdomen and pelvis that was done on 03/03/2023.  An incidental finding was a focal dissection of the distal abdominal aorta just above the bifurcation.  She tells me she underwent an extensive cardiac workup and it was not felt to be cardiac in origin.  She has had symptoms for some time.  She also has significant chronic back pain.  She has had 2 operations on her back.  I do not get any clear-cut history of claudication.  She does have some pain in her legs that involves her hips thighs and calves but this occurs simply with standing also.  I think she likely has neurogenic pain in her legs.  She denies any history of rest pain or nonhealing ulcers.  Her risk factors for peripheral arterial disease include hypercholesterolemia, a family history of premature cardiovascular disease, and tobacco use.  She smokes 1-1/2 packs/day.  She denies any history of diabetes or hypertension.  Past Medical History:  Diagnosis Date   Anxiety    Cancer    breast cancer   Depression    Hypothyroidism    Neuromuscular disorder    neuropathy    Family History  Problem Relation Age of Onset    Hypertension Mother    Heart attack Mother    Hodgkin's lymphoma Father    Heart attack Brother        brother died at 71 MI    SOCIAL HISTORY: Social History   Tobacco Use   Smoking status: Every Day    Packs/day: 1.5    Types: Cigarettes   Smokeless tobacco: Never  Substance Use Topics   Alcohol use: Yes    Comment: occasionally    Allergies  Allergen Reactions   Azithromycin Other (See Comments)    Makes very sick   Codeine Other (See Comments)    Makes very sick   Oxycodone-Acetaminophen Nausea And Vomiting    Other Reaction(s): GI Intolerance    Current Outpatient Medications  Medication Sig Dispense Refill   albuterol (VENTOLIN HFA) 108 (90 Base) MCG/ACT inhaler Inhale 2 puffs into the lungs every 4 (four) hours as needed.     ALPRAZolam (XANAX) 0.5 MG tablet Take 0.5 mg by mouth at bedtime as needed for anxiety.     atorvastatin (LIPITOR) 40 MG tablet Take 40 mg by mouth daily.     cetirizine (ZYRTEC) 10 MG tablet Take 10 mg by mouth at bedtime.     citalopram (CELEXA) 20 MG tablet Take by mouth daily.   5   esomeprazole (NEXIUM) 40  MG capsule Take 40 mg by mouth daily.     EST ESTROGENS-METHYLTEST HS 0.625-1.25 MG tablet Take by mouth daily.   5   fexofenadine (ALLEGRA) 180 MG tablet Take 180 mg by mouth daily.     HYDROcodone-acetaminophen (NORCO) 10-325 MG tablet Take 1 tablet by mouth every 6 (six) hours as needed.     ibuprofen (ADVIL,MOTRIN) 800 MG tablet Take 1 tablet (800 mg total) by mouth every 8 (eight) hours as needed for moderate pain (mild pain). 30 tablet 1   levothyroxine (SYNTHROID, LEVOTHROID) 100 MCG tablet daily before breakfast.   3   naproxen (NAPROSYN) 500 MG tablet Take by mouth.     pregabalin (LYRICA) 150 MG capsule Take by mouth 2 (two) times daily.      sucralfate (CARAFATE) 1 g tablet Take 1 g by mouth at bedtime.     traMADol (ULTRAM) 50 MG tablet Take 50 mg by mouth 2 (two) times daily.     No current facility-administered  medications for this visit.    REVIEW OF SYSTEMS:  [X]  denotes positive finding, [ ]  denotes negative finding Cardiac  Comments:  Chest pain or chest pressure:    Shortness of breath upon exertion: x   Short of breath when lying flat:    Irregular heart rhythm:        Vascular    Pain in calf, thigh, or hip brought on by ambulation: x   Pain in feet at night that wakes you up from your sleep:     Blood clot in your veins:    Leg swelling:         Pulmonary    Oxygen at home:    Productive cough:     Wheezing:         Neurologic    Sudden weakness in arms or legs:  x   Sudden numbness in arms or legs:  x   Sudden onset of difficulty speaking or slurred speech:    Temporary loss of vision in one eye:     Problems with dizziness:         Gastrointestinal    Blood in stool:     Vomited blood:         Genitourinary    Burning when urinating:     Blood in urine:        Psychiatric    Major depression:         Hematologic    Bleeding problems:    Problems with blood clotting too easily:        Skin    Rashes or ulcers:        Constitutional    Fever or chills:    -  PHYSICAL EXAM:   Vitals:   03/28/23 1316  BP: (!) 150/78  Pulse: 69  Resp: 20  Temp: 98.2 F (36.8 C)  SpO2: 95%  Weight: 174 lb 9.6 oz (79.2 kg)  Height: 5\' 3"  (1.6 m)   Body mass index is 30.93 kg/m.  GENERAL: The patient is a well-nourished female, in no acute distress. The vital signs are documented above. CARDIAC: There is a regular rate and rhythm.  VASCULAR: I do not detect carotid bruits. She has palpable femoral pulses bilaterally. She has palpable dorsalis pedis and posterior tibial pulses bilaterally. She has biphasic Doppler signals in the dorsalis pedis and posterior tibial positions bilaterally. PULMONARY: There is good air exchange bilaterally without wheezing or rales. ABDOMEN: Soft and non-tender with normal pitched  bowel sounds.  MUSCULOSKELETAL: There are no major  deformities. NEUROLOGIC: No focal weakness or paresthesias are detected. SKIN: There are no ulcers or rashes noted. PSYCHIATRIC: The patient has a normal affect.  DATA:    CT ABDOMEN PELVIS: I have reviewed the CT of the abdomen and pelvis.  I have reviewed the images.  There is a focal dissection in the distal aorta that ends just above the bifurcation.  The common iliacs are patent.  Deitra Mayo [facility]

## 2024-07-17 ENCOUNTER — Other Ambulatory Visit (HOSPITAL_COMMUNITY)

## 2024-07-17 ENCOUNTER — Ambulatory Visit

## 2024-07-17 ENCOUNTER — Encounter (HOSPITAL_COMMUNITY)

## 2024-07-20 ENCOUNTER — Other Ambulatory Visit: Payer: Self-pay

## 2024-07-20 DIAGNOSIS — I7102 Dissection of abdominal aorta: Secondary | ICD-10-CM

## 2024-07-20 DIAGNOSIS — I739 Peripheral vascular disease, unspecified: Secondary | ICD-10-CM

## 2024-08-05 NOTE — Progress Notes (Deleted)
 HISTORY AND PHYSICAL     CC:  follow up. Requesting Provider:  Jama Chow, MD  HPI: This is a 63 y.o. female who is here today for follow up for PAD.  Pt has hx of incidental finding of focal dissection of the distal abdominal aorta just above the bifurcation.    She was seen by Dr. Eliza on 03/28/2023 for an incidental finding of focal aortic dissection in the distal abdominal aorta which appeared to be chronic. It did not appear to be flow-limiting.  She had undergone an extensive cardiac workup and was not felt to be cardiac in nature.  She has hx of chronic back pain with hx of back operations x 2.  She was not having claudication.  The pain in her legs was felt to be neurogenic.  She did not have rest pain or tissue loss. She was still smoking and Dr. Eliza discussed importance of smoking cessation.  He encouraged her to start a baby asa daily.  He scheduled her for baseline ABI and aorta duplex.  Pt had also been seen by Vascular Surgery in 2017 with hx of continuous ringing the left ear.  She did not have any evidence of enlarged tortuous carotid arteries and her carotid duplex was normal.  She was referred back to ENT.   The pt returns today for follow up.  ***  The pt is on a statin for cholesterol management.    The pt is not on an aspirin.    Other AC:  none The pt is not on medication for hypertension.  The pt is not on diabetic medication. Tobacco hx:  current  Pt does *** have family hx of AAA.  Past Medical History:  Diagnosis Date   Anxiety    Cancer [facility])    breast cancer   Depression    Hypothyroidism    Neuromuscular disorder (HCC)    neuropathy    Past Surgical History:  Procedure Laterality Date   ABDOMINAL HYSTERECTOMY     CERVICAL SPINE SURGERY  12/2000   C3-C7 surgery  done at [facility]   CHOLECYSTECTOMY     DIAGNOSTIC LAPAROSCOPY     GANGLION CYST EXCISION     LAPAROSCOPIC SALPINGO OOPHERECTOMY Right 10/21/2014   Procedure: LAPAROSCOPY,  CONVERT TO LAPAROTOMY WITH RIGHT SALPINGOOOPHORECTOMY;  Surgeon: Charlie CHRISTELLA Croak, MD;  Location: [facility];  Service: Gynecology;  Laterality: Right;   NECK SURGERY  2002   NECK SURGERY  2004    Allergies  Allergen Reactions   Azithromycin Other (See Comments)    Makes very sick   Codeine Other (See Comments)    Makes very sick   Oxycodone -Acetaminophen  Nausea And Vomiting    Other Reaction(s): GI Intolerance    Current Outpatient Medications  Medication Sig Dispense Refill   albuterol (VENTOLIN HFA) 108 (90 Base) MCG/ACT inhaler Inhale 2 puffs into the lungs every 4 (four) hours as needed.     ALPRAZolam  (XANAX ) 0.5 MG tablet Take 0.5 mg by mouth at bedtime as needed for anxiety.     atorvastatin (LIPITOR) 40 MG tablet Take 40 mg by mouth daily.     cetirizine (ZYRTEC) 10 MG tablet Take 10 mg by mouth at bedtime.     citalopram (CELEXA) 20 MG tablet Take by mouth daily.   5   esomeprazole (NEXIUM) 40 MG capsule Take 40 mg by mouth daily.     EST ESTROGENS -METHYLTEST HS 0.625-1.25 MG tablet Take by mouth daily.   5  fexofenadine (ALLEGRA) 180 MG tablet Take 180 mg by mouth daily.     HYDROcodone-acetaminophen  (NORCO) 10-325 MG tablet Take 1 tablet by mouth every 6 (six) hours as needed.     ibuprofen  (ADVIL ,MOTRIN ) 800 MG tablet Take 1 tablet (800 mg total) by mouth every 8 (eight) hours as needed for moderate pain (mild pain). 30 tablet 1   levothyroxine  (SYNTHROID , LEVOTHROID) 100 MCG tablet daily before breakfast.   3   naproxen (NAPROSYN) 500 MG tablet Take by mouth.     pregabalin  (LYRICA ) 150 MG capsule Take by mouth 2 (two) times daily.      sucralfate (CARAFATE) 1 g tablet Take 1 g by mouth at bedtime.     traMADol (ULTRAM) 50 MG tablet Take 50 mg by mouth 2 (two) times daily.     No current facility-administered medications for this visit.    Family History  Problem Relation Age of Onset   Hypertension Mother    Heart attack Mother    Hodgkin's lymphoma Father     Heart attack Brother        brother died at 40 MI    Social History   Socioeconomic History   Marital status: Married    Spouse name: Not on file   Number of children: Not on file   Years of education: Not on file   Highest education level: Not on file  Occupational History   Not on file  Tobacco Use   Smoking status: Every Day    Current packs/day: 1.50    Types: Cigarettes   Smokeless tobacco: Never  Substance and Sexual Activity   Alcohol use: Yes    Comment: occasionally   Drug use: Yes    Types: Marijuana    Comment: last time used marjuina was 2 weeks ago    Sexual activity: Not on file  Other Topics Concern   Not on file  Social History Narrative   Lives alone one level / left handed/ 11th grade education/ disabled - not working/ 1-2 cups coffee a day; not able to exercise   Social Drivers of Health   Financial Resource Strain: Low Risk  (06/18/2024)   Received from [facility]   Overall Financial Resource Strain (CARDIA)    Difficulty of Paying Living Expenses: Not hard at all  Food Insecurity: No Food Insecurity (06/18/2024)   Received from [facility]   Hunger Vital Sign    Within the past 12 months, you worried that your food would run out before you got the money to buy more.: Never true    Within the past 12 months, the food you bought just didn't last and you didn't have money to get more.: Never true  Transportation Needs: No Transportation Needs (06/18/2024)   Received from [facility] - Transportation    Lack of Transportation (Medical): No    Lack of Transportation (Non-Medical): No  Physical Activity: Not on file  Stress: Not on file  Social Connections: Not on file  Intimate Partner Violence: Not on file     REVIEW OF SYSTEMS:  *** [X]  denotes positive finding, [ ]  denotes negative finding Cardiac  Comments:  Chest pain or chest pressure:    Shortness of breath upon exertion:    Short of breath when lying flat:    Irregular  heart rhythm:        Vascular    Pain in calf, thigh, or hip brought on by ambulation:    Pain in  feet at night that wakes you up from your sleep:     Blood clot in your veins:    Leg swelling:         Pulmonary    Oxygen at home:    Productive cough:     Wheezing:         Neurologic    Sudden weakness in arms or legs:     Sudden numbness in arms or legs:     Sudden onset of difficulty speaking or slurred speech:    Temporary loss of vision in one eye:     Problems with dizziness:         Gastrointestinal    Blood in stool:     Vomited blood:         Genitourinary    Burning when urinating:     Blood in urine:        Psychiatric    Major depression:         Hematologic    Bleeding problems:    Problems with blood clotting too easily:        Skin    Rashes or ulcers:        Constitutional    Fever or chills:      PHYSICAL EXAMINATION:  ***  General:  WDWN in NAD; vital signs documented above Gait: Not observed HENT: WNL, normocephalic Pulmonary: normal non-labored breathing , without wheezing Cardiac: {Desc; regular/irreg:14544} HR, {With/Without:20273} carotid bruit*** Abdomen: soft, NT; aortic pulse is *** palpable Skin: {With/Without:20273} rashes Vascular Exam/Pulses:  Right Left  Radial {Exam; arterial pulse strength 0-4:30167} {Exam; arterial pulse strength 0-4:30167}  Femoral {Exam; arterial pulse strength 0-4:30167} {Exam; arterial pulse strength 0-4:30167}  Popliteal {Exam; arterial pulse strength 0-4:30167} {Exam; arterial pulse strength 0-4:30167}  DP {Exam; arterial pulse strength 0-4:30167} {Exam; arterial pulse strength 0-4:30167}  PT {Exam; arterial pulse strength 0-4:30167} {Exam; arterial pulse strength 0-4:30167}  Peroneal *** ***   Extremities: {With/Without:20273} ischemic changes, {With/Without:20273} Gangrene , {With/Without:20273} cellulitis; {With/Without:20273} open wounds Musculoskeletal: no muscle wasting or  atrophy  Neurologic: A&O X 3 Psychiatric:  The pt has {Desc; normal/abnormal:11317::Normal} affect.   Non-Invasive Vascular Imaging:   ABI's/TBI's on 08/06/2024: Right:  *** - Great toe pressure: *** Left:  *** - Great toe pressure: ***  Arterial duplex on 08/06/2024: ***     ASSESSMENT/PLAN:: 63 y.o. female here for follow up for PAD with hx of incidental finding of focal dissection of the distal abdominal aorta just above the bifurcation.     -*** -continue statin -discussed importance of increased walking daily -pt will f/u in *** with ***.   Lucie Apt, [facility] [phone]  Clinic MD:   [place]

## 2024-08-06 ENCOUNTER — Ambulatory Visit (HOSPITAL_COMMUNITY)

## 2024-11-26 ENCOUNTER — Encounter (HOSPITAL_COMMUNITY)

## 2024-11-26 ENCOUNTER — Other Ambulatory Visit (HOSPITAL_COMMUNITY)

## 2024-11-26 ENCOUNTER — Ambulatory Visit

## 2024-12-14 ENCOUNTER — Ambulatory Visit (HOSPITAL_COMMUNITY)
Admission: RE | Admit: 2024-12-14 | Discharge: 2024-12-14 | Disposition: A | Discharge status: Home / Self Care | Source: Ambulatory Visit | Attending: Vascular Surgery | Admitting: Vascular Surgery

## 2024-12-14 ENCOUNTER — Ambulatory Visit: Attending: Vascular Surgery | Admitting: Physician Assistant

## 2024-12-14 ENCOUNTER — Inpatient Hospital Stay (HOSPITAL_COMMUNITY): Admission: RE | Admit: 2024-12-14 | Discharge: 2024-12-14 | Attending: Vascular Surgery

## 2024-12-14 VITALS — BP 127/68 | HR 60 | Temp 97.7°F | Wt 156.7 lb

## 2024-12-14 DIAGNOSIS — I739 Peripheral vascular disease, unspecified: Secondary | ICD-10-CM

## 2024-12-14 DIAGNOSIS — I7102 Dissection of abdominal aorta: Secondary | ICD-10-CM | POA: Insufficient documentation

## 2024-12-14 LAB — VAS US ABI WITH/WO TBI
Left ABI: 1
Right ABI: 0.94

## 2024-12-14 NOTE — Progress Notes (Unsigned)
 "   Office Note   History of Present Illness   Candice Ramirez is a 63 y.o. (1961/09/08) female who presents for follow up.  She had an incidental finding of focal dissection of the distal abdominal aorta just above the bifurcation in March 2024 on CT scan.  She has no prior history of vascular intervention.  She does have a history of chronic back pain with bilateral lower extremity neurogenic pain.  She returns today for follow-up.  She says she is doing okay, but continues to have severe chronic lower back pain.  She will be getting repeat lumbar surgery in January.  Otherwise she denies any other back pain.  She also denies any abdominal pain.  She denies any claudication, rest pain, or tissue loss.  Current Outpatient Medications  Medication Sig Dispense Refill   Oxycodone  HCl 10 MG TABS Take 10 mg by mouth 4 (four) times daily as needed.     albuterol (VENTOLIN HFA) 108 (90 Base) MCG/ACT inhaler Inhale 2 puffs into the lungs every 4 (four) hours as needed.     ALPRAZolam  (XANAX ) 0.5 MG tablet Take 0.5 mg by mouth at bedtime as needed for anxiety.     atorvastatin (LIPITOR) 40 MG tablet Take 40 mg by mouth daily.     cetirizine (ZYRTEC) 10 MG tablet Take 10 mg by mouth at bedtime.     citalopram (CELEXA) 20 MG tablet Take by mouth daily.   5   esomeprazole (NEXIUM) 40 MG capsule Take 40 mg by mouth daily.     EST ESTROGENS -METHYLTEST HS 0.625-1.25 MG tablet Take by mouth daily.   5   fexofenadine (ALLEGRA) 180 MG tablet Take 180 mg by mouth daily.     HYDROcodone-acetaminophen  (NORCO) 10-325 MG tablet Take 1 tablet by mouth every 6 (six) hours as needed.     ibuprofen  (ADVIL ,MOTRIN ) 800 MG tablet Take 1 tablet (800 mg total) by mouth every 8 (eight) hours as needed for moderate pain (mild pain). 30 tablet 1   levothyroxine  (SYNTHROID , LEVOTHROID) 100 MCG tablet daily before breakfast.   3   naproxen (NAPROSYN) 500 MG tablet Take by mouth.     pregabalin  (LYRICA ) 150 MG capsule Take by  mouth 2 (two) times daily.      sucralfate (CARAFATE) 1 g tablet Take 1 g by mouth at bedtime.     traMADol (ULTRAM) 50 MG tablet Take 50 mg by mouth 2 (two) times daily.     No current facility-administered medications for this visit.    REVIEW OF SYSTEMS (negative unless checked):   Cardiac:  []  Chest pain or chest pressure? []  Shortness of breath upon activity? []  Shortness of breath when lying flat? []  Irregular heart rhythm?  Vascular:  []  Pain in calf, thigh, or hip brought on by walking? []  Pain in feet at night that wakes you up from your sleep? []  Blood clot in your veins? []  Leg swelling?  Pulmonary:  []  Oxygen at home? []  Productive cough? []  Wheezing?  Neurologic:  []  Sudden weakness in arms or legs? []  Sudden numbness in arms or legs? []  Sudden onset of difficult speaking or slurred speech? []  Temporary loss of vision in one eye? []  Problems with dizziness?  Gastrointestinal:  []  Blood in stool? []  Vomited blood?  Genitourinary:  []  Burning when urinating? []  Blood in urine?  Psychiatric:  []  Major depression  Hematologic:  []  Bleeding problems? []  Problems with blood clotting?  Dermatologic:  []  Rashes or ulcers?  Constitutional:  []  Fever or chills?  Ear/Nose/Throat:  []  Change in hearing? []  Nose bleeds? []  Sore throat?  Musculoskeletal:  [x]  Back pain? []  Joint pain? []  Muscle pain?   Physical Examination   Vitals:   12/14/24 1421  BP: 127/68  Pulse: 60  Temp: 97.7 F (36.5 C)  TempSrc: Temporal  Weight: 156 lb 11.2 oz (71.1 kg)   Body mass index is 27.76 kg/m.  General:  WDWN in NAD; vital signs documented above Gait: Not observed HENT: WNL, normocephalic Pulmonary: normal non-labored breathing  Cardiac: regular Abdomen: soft, NT, no masses Skin: without rashes Vascular Exam/Pulses: 2+ left DP pulse, 1+ right DP pulse Extremities: without ischemic changes, without gangrene , without cellulitis; without open  wounds;  Musculoskeletal: no muscle wasting or atrophy  Neurologic: A&O X 3;  No focal weakness or paresthesias are detected Psychiatric:  The pt has normal affect.   Non-Invasive Vascular Imaging   AAA Duplex (12/15/2024) No evidence of abdominal aortic dissection.  Maximum aortic diameter is 2.6 cm  ABIs (12/15/2024) +---------+------------------+-----+--------+--------+  Right   Rt Pressure (mmHg)IndexWaveformComment   +---------+------------------+-----+--------+--------+  Brachial 124                                      +---------+------------------+-----+--------+--------+  PTA     130               0.94 biphasic          +---------+------------------+-----+--------+--------+  DP      93                0.67 biphasic          +---------+------------------+-----+--------+--------+  Great Toe89                0.64 Abnormal          +---------+------------------+-----+--------+--------+   +---------+------------------+-----+--------+-------+  Left    Lt Pressure (mmHg)IndexWaveformComment  +---------+------------------+-----+--------+-------+  Brachial 138                                     +---------+------------------+-----+--------+-------+  PTA     138               1.00 biphasic         +---------+------------------+-----+--------+-------+  DP      125               0.91 biphasic         +---------+------------------+-----+--------+-------+  Great Toe86                0.62 Abnormal         +---------+------------------+-----+--------+-------+   +-------+-----------+-----------+------------+------------+  ABI/TBIToday's ABIToday's TBIPrevious ABIPrevious TBI  +-------+-----------+-----------+------------+------------+  Right 0.94       0.64                                 +-------+-----------+-----------+------------+------------+  Left  1.00       0.62                                     Medical Decision Making   TAHIRI SHAREEF is a 63 y.o. (July 08, 1961) female who presents for follow up  Based on  this patient's duplex, there is no evidence of abdominal aortic dissection.  There is also no evidence of AAA, with maximum aortic diameter of 2.6 cm Her ABIs are within normal range bilaterally.  Her right ABI is 0.94 and left ABI is 1.0. She has chronic lower back pain and is planning on undergoing repeat lumbar surgery in January.  Otherwise she denies any other back or abdominal pain.  She also denies any symptoms of PAD such as claudication, rest pain, or tissue loss. On exam her abdomen is soft without pulsatile mass.  Bilateral lower extremities are well-perfused with palpable DP pulses She has no indication for vascular intervention.  She can follow-up with our office in 1 year with repeat AAA duplex and ABIs   Ahmed Holster PA-C [facility] [place] Office: [phone]  Clinic MD: [place]  "
